# Patient Record
Sex: Male | Born: 1960 | Race: Black or African American | Hispanic: No | Marital: Single | Smoking: Never smoker
Health system: Southern US, Community
[De-identification: ages and names within clinical notes are randomized; demographics above are authoritative.]

## PROBLEM LIST (undated history)

## (undated) DIAGNOSIS — M542 Cervicalgia: Secondary | ICD-10-CM

## (undated) DIAGNOSIS — M545 Low back pain: Secondary | ICD-10-CM

## (undated) DIAGNOSIS — I10 Essential (primary) hypertension: Secondary | ICD-10-CM

## (undated) DIAGNOSIS — F411 Generalized anxiety disorder: Secondary | ICD-10-CM

## (undated) DIAGNOSIS — G4730 Sleep apnea, unspecified: Secondary | ICD-10-CM

## (undated) DIAGNOSIS — F329 Major depressive disorder, single episode, unspecified: Secondary | ICD-10-CM

## (undated) DIAGNOSIS — J069 Acute upper respiratory infection, unspecified: Secondary | ICD-10-CM

## (undated) DIAGNOSIS — K219 Gastro-esophageal reflux disease without esophagitis: Secondary | ICD-10-CM

## (undated) DIAGNOSIS — G4733 Obstructive sleep apnea (adult) (pediatric): Secondary | ICD-10-CM

## (undated) DIAGNOSIS — E785 Hyperlipidemia, unspecified: Secondary | ICD-10-CM

## (undated) HISTORY — DX: Generalized anxiety disorder: F41.1

## (undated) HISTORY — DX: Major depressive disorder, single episode, unspecified: F32.9

## (undated) HISTORY — DX: Gastro-esophageal reflux disease without esophagitis: K21.9

## (undated) HISTORY — DX: Sleep apnea, unspecified: G47.30

## (undated) HISTORY — PX: FOOT SURGERY: SHX648

## (undated) HISTORY — DX: Cervicalgia: M54.2

## (undated) HISTORY — DX: Essential (primary) hypertension: I10

## (undated) HISTORY — DX: Acute upper respiratory infection, unspecified: J06.9

## (undated) HISTORY — DX: Low back pain: M54.5

## (undated) HISTORY — DX: Obstructive sleep apnea (adult) (pediatric): G47.33

## (undated) HISTORY — DX: Hyperlipidemia, unspecified: E78.5

---

## 1999-01-13 ENCOUNTER — Inpatient Hospital Stay (HOSPITAL_COMMUNITY): Admission: AD | Admit: 1999-01-13 | Discharge: 1999-01-13 | Payer: Self-pay | Admitting: *Deleted

## 2001-11-26 ENCOUNTER — Ambulatory Visit (HOSPITAL_BASED_OUTPATIENT_CLINIC_OR_DEPARTMENT_OTHER): Admission: RE | Admit: 2001-11-26 | Discharge: 2001-11-26 | Payer: Self-pay | Admitting: Pulmonary Disease

## 2001-11-26 ENCOUNTER — Encounter: Payer: Self-pay | Admitting: Pulmonary Disease

## 2003-07-05 ENCOUNTER — Emergency Department (HOSPITAL_COMMUNITY): Admission: EM | Admit: 2003-07-05 | Discharge: 2003-07-05 | Payer: Self-pay | Admitting: Emergency Medicine

## 2004-01-21 ENCOUNTER — Ambulatory Visit: Payer: Self-pay | Admitting: Internal Medicine

## 2004-04-13 ENCOUNTER — Ambulatory Visit: Payer: Self-pay | Admitting: Internal Medicine

## 2004-04-30 ENCOUNTER — Emergency Department (HOSPITAL_COMMUNITY): Admission: EM | Admit: 2004-04-30 | Discharge: 2004-05-01 | Payer: Self-pay | Admitting: Emergency Medicine

## 2004-05-19 ENCOUNTER — Encounter: Admission: RE | Admit: 2004-05-19 | Discharge: 2004-05-19 | Payer: Self-pay | Admitting: Occupational Medicine

## 2004-07-22 ENCOUNTER — Ambulatory Visit: Payer: Self-pay | Admitting: Internal Medicine

## 2006-02-23 ENCOUNTER — Ambulatory Visit: Payer: Self-pay | Admitting: Internal Medicine

## 2006-02-23 LAB — CONVERTED CEMR LAB
Basophils Absolute: 0 10*3/uL (ref 0.0–0.1)
CO2: 28 meq/L (ref 19–32)
Chloride: 106 meq/L (ref 96–112)
Chol/HDL Ratio, serum: 4.5
Cholesterol: 159 mg/dL (ref 0–200)
Creatinine, Ser: 1.1 mg/dL (ref 0.4–1.5)
Eosinophil percent: 0.7 % (ref 0.0–5.0)
Glucose, Bld: 94 mg/dL (ref 70–99)
HCT: 43.6 % (ref 39.0–52.0)
HDL: 35.3 mg/dL — ABNORMAL LOW (ref >39.0)
Lymphocytes Relative: 23.4 % (ref 12.0–46.0)
MCV: 89.3 fL (ref 78.0–100.0)
Neutro Abs: 5.6 10*3/uL (ref 1.4–7.7)
Neutrophils Relative %: 68.6 % (ref 43.0–77.0)
Nitrite: NEGATIVE
Platelets: 244 10*3/uL (ref 150–400)
Sodium: 141 meq/L (ref 135–145)
Urine Glucose: NEGATIVE mg/dL
WBC: 8.1 10*3/uL (ref 4.5–10.5)

## 2006-02-28 ENCOUNTER — Ambulatory Visit: Payer: Self-pay | Admitting: Internal Medicine

## 2007-01-10 ENCOUNTER — Ambulatory Visit: Payer: Self-pay | Admitting: Internal Medicine

## 2007-01-10 LAB — CONVERTED CEMR LAB
AST: 32 units/L (ref 0–37)
Albumin: 4 g/dL (ref 3.5–5.2)
Basophils Absolute: 0.2 10*3/uL — ABNORMAL HIGH (ref 0.0–0.1)
Chloride: 104 meq/L (ref 96–112)
Cholesterol: 160 mg/dL (ref 0–200)
Eosinophils Absolute: 0.1 10*3/uL (ref 0.0–0.6)
GFR calc Af Amer: 83 mL/min
GFR calc non Af Amer: 69 mL/min
HCT: 41.8 % (ref 39.0–52.0)
HDL: 30.7 mg/dL — ABNORMAL LOW (ref >39.0)
Hemoglobin, Urine: NEGATIVE
Ketones, ur: NEGATIVE mg/dL
LDL Cholesterol: 116 mg/dL — ABNORMAL HIGH (ref 0–99)
MCHC: 34.3 g/dL (ref 30.0–36.0)
MCV: 88.1 fL (ref 78.0–100.0)
Neutro Abs: 4.7 10*3/uL (ref 1.4–7.7)
Neutrophils Relative %: 59.9 % (ref 43.0–77.0)
PSA: 0.67 ng/mL (ref 0.10–4.00)
Potassium: 4.1 meq/L (ref 3.5–5.1)
RBC: 4.74 M/uL (ref 4.22–5.81)
Sodium: 137 meq/L (ref 135–145)
TSH: 2.39 microintl units/mL (ref 0.35–5.50)
Total CHOL/HDL Ratio: 5.2
Urine Glucose: NEGATIVE mg/dL
Urobilinogen, UA: 0.2 (ref 0.0–1.0)

## 2007-01-17 ENCOUNTER — Encounter: Payer: Self-pay | Admitting: Internal Medicine

## 2007-01-17 ENCOUNTER — Ambulatory Visit: Payer: Self-pay | Admitting: Internal Medicine

## 2007-01-17 DIAGNOSIS — I10 Essential (primary) hypertension: Secondary | ICD-10-CM | POA: Insufficient documentation

## 2007-01-17 DIAGNOSIS — F411 Generalized anxiety disorder: Secondary | ICD-10-CM

## 2007-01-17 DIAGNOSIS — F3289 Other specified depressive episodes: Secondary | ICD-10-CM

## 2007-01-17 DIAGNOSIS — K219 Gastro-esophageal reflux disease without esophagitis: Secondary | ICD-10-CM

## 2007-01-17 DIAGNOSIS — G4733 Obstructive sleep apnea (adult) (pediatric): Secondary | ICD-10-CM

## 2007-01-17 DIAGNOSIS — M545 Low back pain, unspecified: Secondary | ICD-10-CM

## 2007-01-17 DIAGNOSIS — F329 Major depressive disorder, single episode, unspecified: Secondary | ICD-10-CM

## 2007-01-17 DIAGNOSIS — E785 Hyperlipidemia, unspecified: Secondary | ICD-10-CM

## 2007-01-17 HISTORY — DX: Generalized anxiety disorder: F41.1

## 2007-01-17 HISTORY — DX: Gastro-esophageal reflux disease without esophagitis: K21.9

## 2007-01-17 HISTORY — DX: Essential (primary) hypertension: I10

## 2007-01-17 HISTORY — DX: Major depressive disorder, single episode, unspecified: F32.9

## 2007-01-17 HISTORY — DX: Obstructive sleep apnea (adult) (pediatric): G47.33

## 2007-01-17 HISTORY — DX: Low back pain, unspecified: M54.50

## 2007-01-17 HISTORY — DX: Other specified depressive episodes: F32.89

## 2007-01-17 HISTORY — DX: Hyperlipidemia, unspecified: E78.5

## 2007-02-20 ENCOUNTER — Ambulatory Visit: Payer: Self-pay | Admitting: Internal Medicine

## 2008-03-04 ENCOUNTER — Ambulatory Visit: Payer: Self-pay | Admitting: Internal Medicine

## 2008-03-04 LAB — CONVERTED CEMR LAB
ALT: 27 units/L (ref 0–53)
Basophils Absolute: 0 10*3/uL (ref 0.0–0.1)
Basophils Relative: 0.3 % (ref 0.0–3.0)
Bilirubin Urine: NEGATIVE
Bilirubin, Direct: 0.1 mg/dL (ref 0.0–0.3)
CO2: 31 meq/L (ref 19–32)
Calcium: 9.2 mg/dL (ref 8.4–10.5)
Cholesterol: 168 mg/dL (ref 0–200)
Creatinine, Ser: 1.1 mg/dL (ref 0.4–1.5)
GFR calc Af Amer: 92 mL/min
Glucose, Bld: 95 mg/dL (ref 70–99)
HCT: 45.2 % (ref 39.0–52.0)
HDL: 30.3 mg/dL — ABNORMAL LOW (ref >39.0)
Hemoglobin: 15.5 g/dL (ref 13.0–17.0)
Ketones, ur: NEGATIVE mg/dL
MCHC: 34.2 g/dL (ref 30.0–36.0)
Monocytes Absolute: 0.5 10*3/uL (ref 0.1–1.0)
Neutro Abs: 6.3 10*3/uL (ref 1.4–7.7)
PSA: 0.84 ng/mL (ref 0.10–4.00)
RBC: 5.07 M/uL (ref 4.22–5.81)
RDW: 12.4 % (ref 11.5–14.6)
Sodium: 140 meq/L (ref 135–145)
Specific Gravity, Urine: 1.01 (ref 1.000–1.03)
TSH: 2.44 microintl units/mL (ref 0.35–5.50)
Total Protein, Urine: NEGATIVE mg/dL
Total Protein: 7.8 g/dL (ref 6.0–8.3)
Triglycerides: 96 mg/dL (ref 0–149)
Urine Glucose: NEGATIVE mg/dL
pH: 7.5 (ref 5.0–8.0)

## 2008-03-05 ENCOUNTER — Encounter (INDEPENDENT_AMBULATORY_CARE_PROVIDER_SITE_OTHER): Payer: Self-pay | Admitting: *Deleted

## 2008-04-07 ENCOUNTER — Ambulatory Visit: Payer: Self-pay | Admitting: Pulmonary Disease

## 2008-04-20 ENCOUNTER — Encounter: Payer: Self-pay | Admitting: Pulmonary Disease

## 2008-05-12 ENCOUNTER — Encounter: Payer: Self-pay | Admitting: Pulmonary Disease

## 2008-05-18 ENCOUNTER — Encounter: Payer: Self-pay | Admitting: Pulmonary Disease

## 2009-01-12 ENCOUNTER — Ambulatory Visit: Payer: Self-pay | Admitting: Internal Medicine

## 2009-01-12 LAB — CONVERTED CEMR LAB
ALT: 33 units/L (ref 0–53)
AST: 26 units/L (ref 0–37)
Alkaline Phosphatase: 102 units/L (ref 39–117)
BUN: 12 mg/dL (ref 6–23)
Basophils Absolute: 0.1 10*3/uL (ref 0.0–0.1)
Bilirubin, Direct: 0.1 mg/dL (ref 0.0–0.3)
CO2: 30 meq/L (ref 19–32)
Calcium: 9 mg/dL (ref 8.4–10.5)
Creatinine, Ser: 1.1 mg/dL (ref 0.4–1.5)
Eosinophils Relative: 0.8 % (ref 0.0–5.0)
GFR calc non Af Amer: 90.95 mL/min (ref >60)
Glucose, Bld: 93 mg/dL (ref 70–99)
HCT: 43 % (ref 39.0–52.0)
Ketones, ur: NEGATIVE mg/dL
LDL Cholesterol: 104 mg/dL — ABNORMAL HIGH (ref 0–99)
Leukocytes, UA: NEGATIVE
Lymphocytes Relative: 21.4 % (ref 12.0–46.0)
Monocytes Relative: 5.5 % (ref 3.0–12.0)
Neutrophils Relative %: 71.5 % (ref 43.0–77.0)
Nitrite: NEGATIVE
Platelets: 200 10*3/uL (ref 150.0–400.0)
RDW: 12.6 % (ref 11.5–14.6)
Sodium: 140 meq/L (ref 135–145)
Specific Gravity, Urine: 1.015 (ref 1.000–1.030)
Total Bilirubin: 0.9 mg/dL (ref 0.3–1.2)
Total CHOL/HDL Ratio: 5
Triglycerides: 110 mg/dL (ref 0.0–149.0)
Urobilinogen, UA: 0.2 (ref 0.0–1.0)
WBC: 8.6 10*3/uL (ref 4.5–10.5)

## 2009-01-19 ENCOUNTER — Ambulatory Visit: Payer: Self-pay | Admitting: Internal Medicine

## 2009-01-27 ENCOUNTER — Encounter: Payer: Self-pay | Admitting: Internal Medicine

## 2009-03-20 HISTORY — PX: OTHER SURGICAL HISTORY: SHX169

## 2009-03-26 ENCOUNTER — Telehealth: Payer: Self-pay | Admitting: Internal Medicine

## 2009-05-21 ENCOUNTER — Ambulatory Visit (HOSPITAL_COMMUNITY): Admission: RE | Admit: 2009-05-21 | Discharge: 2009-05-21 | Payer: Self-pay | Admitting: Orthopedic Surgery

## 2009-05-31 ENCOUNTER — Encounter: Payer: Self-pay | Admitting: Internal Medicine

## 2009-07-16 ENCOUNTER — Ambulatory Visit (HOSPITAL_COMMUNITY): Admission: RE | Admit: 2009-07-16 | Discharge: 2009-07-16 | Payer: Self-pay | Admitting: Orthopedic Surgery

## 2009-07-23 ENCOUNTER — Encounter: Payer: Self-pay | Admitting: Internal Medicine

## 2009-11-09 ENCOUNTER — Encounter: Payer: Self-pay | Admitting: Internal Medicine

## 2010-04-10 ENCOUNTER — Encounter: Payer: Self-pay | Admitting: Orthopedic Surgery

## 2010-04-19 NOTE — Progress Notes (Signed)
Summary: Levitra rx  Phone Note Call from Patient Call back at Home Phone [phone]   Summary of Call: Patient called requesting prescription for Levitra. It was never filled and just given to patient in case he wanted to fill it. I made patient aware we would send to pharmacy. Faxed same RX given to patient by MD. Initial call taken by: Lucious Groves,  March 26, 2009 10:45 AM  Follow-up for Phone Call        noted Follow-up by: [name] MD,  March 26, 2009 10:57 AM    New/Updated Medications: LEVITRA 20 MG  TABS (VARDENAFIL HCL) 1 by mouth once daily as needed Prescriptions: LEVITRA 20 MG  TABS (VARDENAFIL HCL) 1 by mouth once daily as needed  #5 x 11   Entered by:   Lucious Groves   Authorized by:   [name] MD   Signed by:   Lucious Groves on 03/26/2009   Method used:   Electronically to        [facility]. [phone]* (retail)       [address]       [place], [place]  [address]       Ph: [phone]       Fax: [phone]   RxID:   [id number]

## 2010-04-19 NOTE — Letter (Signed)
Summary: Referral - not able to see patient  [facility]  [address] [place], [place] [address]   Phone: [phone]  Fax: [phone]    November 09, 2009    [name], M.D. [address] [place], [place] [address]   Re:   Joseph Choi DOB:  [birth year] MRN:   [id number]    Dear Dr. Jonny Ruiz:  Thank you for your kind referral of the above patient.  We have attempted to schedule the recommended procedure Screening Colonoscopy but have not been able to schedule because:   X  The patient was not available by phone and/or has not returned our calls.  ___ The patient declined to schedule the procedure at this time.  We appreciate the referral and hope that we will have the opportunity to treat this patient in the future.    Sincerely,    [facility] [phone]

## 2010-04-19 NOTE — Miscellaneous (Signed)
Summary: Orders Update   Clinical Lists Changes  Orders: Added new Referral order of Gastroenterology Referral (GI) - Signed 

## 2010-04-19 NOTE — Letter (Signed)
Summary: CMN for CPAP Supplies/[facility]  CMN for CPAP Supplies/[facility]   Imported By: Sherian Rein 07/28/2009 07:48:56  _____________________________________________________________________  External Attachment:    Type:   Image     Comment:   External Document

## 2010-05-09 ENCOUNTER — Encounter: Payer: Self-pay | Admitting: Internal Medicine

## 2010-05-09 ENCOUNTER — Other Ambulatory Visit: Payer: Self-pay | Admitting: Internal Medicine

## 2010-05-09 ENCOUNTER — Ambulatory Visit (INDEPENDENT_AMBULATORY_CARE_PROVIDER_SITE_OTHER): Payer: BC Managed Care – PPO | Admitting: Internal Medicine

## 2010-05-09 ENCOUNTER — Encounter (INDEPENDENT_AMBULATORY_CARE_PROVIDER_SITE_OTHER): Payer: Self-pay | Admitting: *Deleted

## 2010-05-09 ENCOUNTER — Other Ambulatory Visit: Payer: BC Managed Care – PPO

## 2010-05-09 DIAGNOSIS — M542 Cervicalgia: Secondary | ICD-10-CM | POA: Insufficient documentation

## 2010-05-09 DIAGNOSIS — Z0000 Encounter for general adult medical examination without abnormal findings: Secondary | ICD-10-CM

## 2010-05-09 DIAGNOSIS — J069 Acute upper respiratory infection, unspecified: Secondary | ICD-10-CM

## 2010-05-09 HISTORY — DX: Acute upper respiratory infection, unspecified: J06.9

## 2010-05-09 HISTORY — DX: Cervicalgia: M54.2

## 2010-05-09 LAB — URINALYSIS
Bilirubin Urine: NEGATIVE
Hgb urine dipstick: NEGATIVE
Ketones, ur: NEGATIVE
Leukocytes, UA: NEGATIVE
Urobilinogen, UA: 0.2 (ref 0.0–1.0)
pH: 7 (ref 5.0–8.0)

## 2010-05-09 LAB — LIPID PANEL
LDL Cholesterol: 89 mg/dL (ref 0–99)
Total CHOL/HDL Ratio: 4
VLDL: 9.8 mg/dL (ref 0.0–40.0)

## 2010-05-09 LAB — CBC WITH DIFFERENTIAL/PLATELET
Basophils Absolute: 0 10*3/uL (ref 0.0–0.1)
Hemoglobin: 14.9 g/dL (ref 13.0–17.0)
Lymphocytes Relative: 19.6 % (ref 12.0–46.0)
Monocytes Relative: 10.6 % (ref 3.0–12.0)
Platelets: 196 10*3/uL (ref 150.0–400.0)
RDW: 13.8 % (ref 11.5–14.6)
WBC: 9.1 10*3/uL (ref 4.5–10.5)

## 2010-05-09 LAB — PSA: PSA: 0.79 ng/mL (ref 0.10–4.00)

## 2010-05-09 LAB — HEPATIC FUNCTION PANEL
AST: 25 U/L (ref 0–37)
Alkaline Phosphatase: 98 U/L (ref 39–117)
Bilirubin, Direct: 0.1 mg/dL (ref 0.0–0.3)
Total Bilirubin: 0.6 mg/dL (ref 0.3–1.2)

## 2010-05-09 LAB — BASIC METABOLIC PANEL
BUN: 15 mg/dL (ref 6–23)
Calcium: 9.4 mg/dL (ref 8.4–10.5)
GFR: 78.79 mL/min (ref >60.00)
Glucose, Bld: 80 mg/dL (ref 70–99)
Sodium: 141 mEq/L (ref 135–145)

## 2010-05-09 LAB — TSH: TSH: 2.57 u[IU]/mL (ref 0.35–5.50)

## 2010-05-17 NOTE — Assessment & Plan Note (Signed)
Summary: last ov 2010/needs bp evaluated   Vital Signs:  Patient profile:   50 year old male Height:      67.5 inches Weight:      298.25 pounds BMI:     46.19 O2 Sat:      97 % on Room air Temp:     99.1 degrees F oral Pulse rate:   54 / minute BP sitting:   166 / 102  (left arm) Cuff size:   large  Vitals Entered By: Zella Ball Ewing CMA (AAMA) (May 09, 2010 2:24 PM)  O2 Flow:  Room air  CC: BP elevated, right side of neck and shoulder pain/RE   Primary Care Provider:  Dr. Jonny Ruiz  CC:  BP elevated and right side of neck and shoulder pain/RE.  History of Present Illness: here for wellness and f/u;  overall doing ok;  Pt denies CP, worsening sob, doe, wheezing, orthopnea, pnd, worsening LE edema, palps, dizziness or syncope  Pt denies new neuro symptoms such as headache, facial or extremity weakness  Pt denies polydipsia, polyuria   Overall good compliance with meds, trying to follow low chol  diet, wt down 50 lbs with better diet, little excercise however .  No fever, wt loss, night sweats, loss of appetite or other constitutional symptoms  Overall good compliance with meds, and good tolerability.  Denies worsening depressive symptoms, suicidal ideation, or panic.   Pt states good ability with ADL's, low fall risk, home safety reviewed and adequate, no significant change in hearing or vision, trying to follow lower chol diet, and occasionally active only with regular excercise.   Also incidently with 3-4 days onset sharp pain , constant, 7/10, throbbing - like to the right neck/upper back, worse to turn the head,  non radicular, No RUE pain/weak/numb, bowel or bladder change, gait change, fall, injury, fever, wt loss.   Pt did quite a bit of lifting at work recently,  no rash, fever, trauma.  Nothing makes better.   Unaware of any URI symtpoms or fever today  Preventive Screening-Counseling & Management      Drug Use:  no.    Problems Prior to Update: 1)  Uri  (ICD-465.9) 2)   Neck Pain, Right  (ICD-723.1) 3)  Preventive Health Care  (ICD-V70.0) 4)  Preventive Health Care  (ICD-V70.0) 5)  Family History Diabetes 1st Degree Relative  (ICD-V18.0) 6)  Anxiety  (ICD-300.00) 7)  Depression  (ICD-311) 8)  Gerd  (ICD-530.81) 9)  Obstructive Sleep Apnea  (ICD-327.23) 10)  Hyperlipidemia  (ICD-272.4) 11)  Low Back Pain  (ICD-724.2) 12)  Hypertension  (ICD-401.9)  Medications Prior to Update: 1)  Lotrel 5-20 Mg Caps (Amlodipine Besy-Benazepril Hcl) .Marland Kitchen.. 1 By Mouth Once Daily 2)  Adult Aspirin Ec Low Strength 81 Mg Tbec (Aspirin) .Marland Kitchen.. 1 By Mouth Once Daily 3)  Cpap 13 Advanced  Current Medications (verified): 1)  Lotrel 5-20 Mg Caps (Amlodipine Besy-Benazepril Hcl) .Marland Kitchen.. 1 By Mouth Once Daily 2)  Adult Aspirin Ec Low Strength 81 Mg Tbec (Aspirin) .Marland Kitchen.. 1 By Mouth Once Daily 3)  Cpap 13 Advanced 4)  Prednisone 10 Mg Tabs (Prednisone) .... 3po Qd For 3days, Then 2po Qd For 3days, Then 1po Qd For 3days, Then Stop 5)  Tramadol Hcl 100 Mg Xr24h-Tab (Tramadol Hcl) .Marland Kitchen.. 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 no children/fiancee died 08-29-86 Drug use-no  Risk Factors: Smoking Status: never (01/17/2007)  Past Surgical History: s/p bilateral knee arthroscopy August 28, 2009 - Dr Darrelyn Hillock  Social History: Never Smoked 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; 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         all otherwise negative per pt -    Physical Exam  General:  alert and overweight-appearing.   Head:  normocephalic and atraumatic.   Eyes:  vision grossly intact, pupils equal, and pupils round.   Ears:  R ear normal and L ear normal.   Nose:  no external deformity and no nasal discharge.   Mouth:  pharyngeal erythema and fair dentition.   Neck:  supple and no masses.   Lungs:  normal respiratory effort and normal breath sounds.   Heart:  normal rate and regular rhythm.   Abdomen:  soft, non-tender, and normal bowel sounds.   Msk:  no joint tenderness and no joint swelling.   Extremities:  no edema, no erythema  Neurologic:  cranial nerves II-XII intact and strength normal in all extremities.   Skin:  color normal and no rashes.   Psych:  not anxious appearing and not depressed appearing.     Impression & Recommendations:  Problem # 1:  Preventive Health Care (ICD-V70.0) Overall doing well, age appropriate education and counseling updated, referral for preventive services and immunizations addressed, dietary counseling and smoking status adressed , most recent labs reviewed I have personally reviewed and have noted 1.The patient's medical and social history 2.Their use of alcohol, tobacco or illicit drugs 3.Their current medications and supplements 4. Functional ability including ADL's, fall risk, home safety risk, hearing & visual impairment  5.Diet and physical activities 6.Evidence for depression or  mood disorders The patients weight, height, BMI  have been recorded in the chart I have made referrals, counseling and provided education to the patient based review of the above  Orders: Gastroenterology Referral (GI) TLB-BMP (Basic Metabolic Panel-BMET) (80048-METABOL) TLB-CBC Platelet - w/Differential (85025-CBCD) TLB-Hepatic/Liver Function Pnl (80076-HEPATIC) TLB-Lipid Panel (80061-LIPID) TLB-TSH (Thyroid Stimulating Hormone) (84443-TSH) TLB-PSA (Prostate Specific Antigen) (84153-PSA) TLB-Udip ONLY (81003-UDIP)  Problem # 2:  NECK PAIN, RIGHT (ICD-723.1)  His updated medication list for this problem includes:    Adult Aspirin Ec Low Strength 81 Mg Tbec (Aspirin) .Marland Kitchen... 1 by mouth once daily    Tramadol Hcl 100 Mg Xr24h-tab (Tramadol hcl) .Marland Kitchen... 1-2 by mouth once daily as needed pain unclear etiology, mod to severe pain, but exam o/w benign for acute - for pain med/predpack , f/u any persistent or worsening symtpoms , treat as above, f/u any worsening signs or symptoms  Discussed exercises and use of moist heat or cold and medication.   Problem # 3:  HYPERTENSION (ICD-401.9)  His updated  medication list for this problem includes:    Lotrel 5-20 Mg Caps (Amlodipine besy-benazepril hcl) .Marland Kitchen... 1 by mouth once daily to re-start med, f/u BP at home and next visit, cont wt loss efforts as he hass been doing   BP today: 166/102 Prior BP: 138/82 (01/19/2009)  Labs Reviewed: K+: 4.2 (01/12/2009) Creat: : 1.1 (01/12/2009)   Chol: 158 (01/12/2009)   HDL: 32.00 (01/12/2009)   LDL: 104 (01/12/2009)   TG: 110.0 (01/12/2009)  Problem # 4:  URI (ICD-465.9)  His updated medication list for this problem includes:    Adult Aspirin Ec Low Strength 81 Mg Tbec (Aspirin) .Marland Kitchen... 1 by mouth once daily incidently prob viral;  to avoid decongestants  Instructed on symptomatic treatment. Call if symptoms persist or worsen.   Complete Medication List: 1)  Lotrel 5-20 Mg Caps (Amlodipine  besy-benazepril hcl) .Marland Kitchen.. 1 by mouth once daily 2)  Adult Aspirin Ec Low Strength 81 Mg Tbec (Aspirin) .Marland Kitchen.. 1 by mouth once daily 3)  Cpap 13 Advanced  4)  Prednisone 10 Mg Tabs (Prednisone) .... 3po qd for 3days, then 2po qd for 3days, then 1po qd for 3days, then stop 5)  Tramadol Hcl 100 Mg Xr24h-tab (Tramadol hcl) .Marland Kitchen.. 1-2 by mouth once daily as needed pain  Patient Instructions: 1)  Continue all previous medications as before this visit  2)  You will be contacted about the referral(s) to: colonscopy (for the summer) 3)  Please go to the Lab in the basement for your blood and/or urine tests today 4)  Please call the number on the [facility] Card for results of your testing  5)  Please schedule a follow-up appointment in 6 months, or sooner if needed 6)  Check your Blood Pressure regularly. If it is above 140/90: you should make an appointment. Prescriptions: TRAMADOL HCL 100 MG XR24H-TAB (TRAMADOL HCL) 1-2 by mouth once daily as needed pain  #60 x 0   Entered and Authorized by:   [name] MD   Signed by:   [name] MD on 05/09/2010   Method used:   Print then Give to Patient   RxID:   [id number] PREDNISONE 10 MG TABS (PREDNISONE) 3po qd for 3days, then 2po qd for 3days, then 1po qd for 3days, then stop  #18 x 0   Entered and Authorized by:   [name] MD   Signed by:   [name] MD on 05/09/2010   Method used:   Print then Give to Patient   RxID:   [id number] LOTREL 5-20 MG CAPS (AMLODIPINE BESY-BENAZEPRIL HCL) 1 by mouth once daily  #90 x 3   Entered and Authorized by:   [name] MD   Signed by:   [name] MD on 05/09/2010   Method used:   Print then Give to Patient   RxID:   [id number]    Orders Added: 1)  Gastroenterology Referral [GI] 2)  TLB-BMP (Basic Metabolic Panel-BMET) [80048-METABOL] 3)  TLB-CBC Platelet - w/Differential [85025-CBCD] 4)  TLB-Hepatic/Liver Function Pnl [80076-HEPATIC] 5)  TLB-Lipid Panel [80061-LIPID] 6)  TLB-TSH  (Thyroid Stimulating Hormone) [84443-TSH] 7)  TLB-PSA (Prostate Specific Antigen) [84153-PSA] 8)  TLB-Udip ONLY [81003-UDIP] 9)  Est. Patient 40-64 years [01027]

## 2010-06-07 LAB — DIFFERENTIAL
Eosinophils Absolute: 0.1 10*3/uL (ref 0.0–0.7)
Lymphs Abs: 2.2 10*3/uL (ref 0.7–4.0)
Monocytes Relative: 6 % (ref 3–12)
Neutro Abs: 7.7 10*3/uL (ref 1.7–7.7)
Neutrophils Relative %: 72 % (ref 43–77)

## 2010-06-07 LAB — CBC
Hemoglobin: 14.1 g/dL (ref 13.0–17.0)
MCHC: 33.2 g/dL (ref 30.0–36.0)
RDW: 13.5 % (ref 11.5–15.5)

## 2010-06-07 LAB — COMPREHENSIVE METABOLIC PANEL
ALT: 31 U/L (ref 0–53)
Calcium: 9.4 mg/dL (ref 8.4–10.5)
Glucose, Bld: 88 mg/dL (ref 70–99)
Sodium: 141 mEq/L (ref 135–145)
Total Protein: 7.6 g/dL (ref 6.0–8.3)

## 2010-06-07 LAB — URINALYSIS, ROUTINE W REFLEX MICROSCOPIC
Nitrite: NEGATIVE
Specific Gravity, Urine: 1.013 (ref 1.005–1.030)
Urobilinogen, UA: 0.2 mg/dL (ref 0.0–1.0)

## 2010-06-07 LAB — PROTIME-INR: INR: 1.18 (ref 0.00–1.49)

## 2010-06-07 LAB — APTT: aPTT: 33 seconds (ref 24–37)

## 2010-06-10 LAB — COMPREHENSIVE METABOLIC PANEL
ALT: 24 U/L (ref 0–53)
AST: 22 U/L (ref 0–37)
Albumin: 3.9 g/dL (ref 3.5–5.2)
CO2: 30 mEq/L (ref 19–32)
Calcium: 9.2 mg/dL (ref 8.4–10.5)
Chloride: 106 mEq/L (ref 96–112)
GFR calc Af Amer: >60 mL/min (ref >60)
GFR calc non Af Amer: >60 mL/min (ref >60)
Sodium: 141 mEq/L (ref 135–145)
Total Bilirubin: 0.8 mg/dL (ref 0.3–1.2)

## 2010-06-10 LAB — URINALYSIS, ROUTINE W REFLEX MICROSCOPIC
Glucose, UA: NEGATIVE mg/dL
Hgb urine dipstick: NEGATIVE
Protein, ur: NEGATIVE mg/dL
pH: 7.5 (ref 5.0–8.0)

## 2010-06-10 LAB — PROTIME-INR: Prothrombin Time: 14.4 seconds (ref 11.6–15.2)

## 2010-06-10 LAB — DIFFERENTIAL
Eosinophils Absolute: 0 10*3/uL (ref 0.0–0.7)
Eosinophils Relative: 1 % (ref 0–5)
Lymphs Abs: 1.7 10*3/uL (ref 0.7–4.0)
Monocytes Absolute: 0.4 10*3/uL (ref 0.1–1.0)

## 2010-06-10 LAB — CBC
RBC: 4.98 MIL/uL (ref 4.22–5.81)
WBC: 8.1 10*3/uL (ref 4.0–10.5)

## 2010-08-05 NOTE — Discharge Summary (Signed)
[place]. [facility]  Patient:    Joseph Choi, Joseph Choi                        MRN: [id number] Adm. Date:  01/13/99 Disc. Date: 01/13/99 Attending:  [name], M.D. [facility] Dictator:   [name], P.A. CC:         [name], M.D. [facility]   Discharge Summary  PROCEDURES:  Cardiac catheterization.  HOSPITAL COURSE:  Joseph Choi is a 50 year old male, without prior history of heart disease, who was recently evaluated in our office by Dr. [name] for abnormal EKG and new onset chest pain.  He was referred for stress testing; this was performed October 18 and profusion images were suggestive of mild inferobasal ischemia. f note, the patient did not report any chest pain during the test, nor was there ny EKG evidence of ischemia.  Calculated EF was 76%.  Given this finding, however,  plan was to proceed with diagnostic coronary angiography as an outpatient, and n fact, patient was scheduled to undergo this procedure the day following this admission.  However, over the past few days, the patient reported progressive and more persistent chest pain.  He called the cardiology office on the day of admission and was instructed to proceed directly to the hospital for same day coronary angiography.  Upon admission, Joseph Choi did note some persistent chest pain and reported some relief after one sublingual nitroglycerin tablet.  An electrocardiogram was, however, nondiagnostic.  Patient was taken shortly thereafter directly to the cardiac catheterization lab, where he underwent angiography by Dr. [name] (see cath report for full details). Angiogram revealed normal coronary arteries and LV gram revealed no wall motion  abnormality with EF approximately 60%.  Additionally, distal aortogram revealed no renal vascular or aortoiliac disease.  Patient was cleared for discharge later that evening and was placed on a trial f pro time pump inhibitor for  possible GERD etiology.  LABORATORY DATA:  (Preadmission):  Normal CBC.  Normal B-MET.  INR of 1.3.  MEDICATIONS: 1. Prilosec 20 mg q.d. 2. Zestoretic 20/12.5 mg q.d.  INSTRUCTIONS:  ACTIVITY:  Patient is to refrain from heavy lifting/driving/strenuous activity 48 hours.  FOLLOW-UP:  He is to call the cardiology office if he has any swelling/bleeding  from the right groin.  Patient is instructed to follow up with his primary care physician, Dr. [name], in the next week for further evaluation.  DISCHARGE DIAGNOSES: 1. Nonischemic chest pain.    a. Normal coronary artery/left ventricle - cardiac catheterization October 26. 2. _________ . 3. Obesity. 4. Obstructive sleep apnea. DD:  01/13/99 TD:  01/13/99 Job: [id number]

## 2010-08-05 NOTE — Cardiovascular Report (Signed)
[place]  Patient:    SIDDIQ, KALUZNY Visit Number: [id number] MRN: [id number]          Service Type: Attending:  [name], M.D. [facility] Dictated by:   [name], M.D. [facility] Proc. Date: 01/13/99   CC:         [name], M.D. [facility]  Luis Abed, M.D. [facility]  Cardiopulmonary Laboratory   Cardiac Catheterization  PROCEDURES PERFORMED: Cardiac catheterization.  CLINICAL HISTORY: The patient is a 50 year old gentleman, who was admitted with chest pain. He has a history of hypertension and obstructive sleep apnea and obesity.  DESCRIPTION OF PROCEDURE: The procedure was performed via the right femoral artery using an arterial sheath and 6 French preformed coronary catheters.  A front wall arterial puncture was performed and Omnipaque contrast was used. The patient tolerated the procedure well and left the laboratory in satisfactory condition. The right femoral artery was closed with Perclose at the end of the procedure.  RESULTS: The left main coronary artery: The left main coronary artery was free of significant disease.  Left anterior descending: The left anterior descending artery gave rise to a diagonal branch and septal perforator.  These and the LAD proper were free of significant disease.  Circumflex artery: The circumflex artery gave rise to two marginal branches, an apical branch and a posterolateral branch. These vessels were free of significant disease.  Right coronary artery: The right coronary gave rise to a conus branch, a right ventricular branch, a posterior descending branch and two posterolateral branches.  These vessels were free of significant disease.  LEFT VENTRICULOGRAPHY: The left ventriculogram performed in the RAO projection showed good wall motion with no areas of hypokinesis. The estimated ejection fraction was 60%.  DISTAL AORTOGRAM: Distal aortogram was performed which showed no renal  artery stenosis and no aortoiliac obstruction.  CONCLUSIONS: Normal coronary angiography and left ventricular wall motion.  RECOMMENDATIONS: Reassurance. Dictated by:   Everardo Beals Juanda Chance, M.D. [facility] Attending:  Everardo Beals Juanda Chance, M.D. [facility] DD:  12/19/00 TD:  12/20/00 Job: [id number]

## 2010-10-19 ENCOUNTER — Ambulatory Visit: Payer: BC Managed Care – PPO | Admitting: Internal Medicine

## 2011-05-15 ENCOUNTER — Other Ambulatory Visit: Payer: Self-pay | Admitting: Internal Medicine

## 2011-11-13 ENCOUNTER — Other Ambulatory Visit: Payer: Self-pay | Admitting: Internal Medicine

## 2012-05-30 ENCOUNTER — Other Ambulatory Visit: Payer: Self-pay | Admitting: Internal Medicine

## 2012-05-31 ENCOUNTER — Other Ambulatory Visit: Payer: Self-pay | Admitting: Internal Medicine

## 2012-06-06 ENCOUNTER — Telehealth: Payer: Self-pay

## 2012-06-06 ENCOUNTER — Other Ambulatory Visit: Payer: Self-pay | Admitting: Internal Medicine

## 2012-06-06 MED ORDER — AMLODIPINE BESY-BENAZEPRIL HCL 5-20 MG PO CAPS: 1.0000 | ORAL_CAPSULE | Freq: Every day | ORAL | Status: DC

## 2012-06-06 NOTE — Telephone Encounter (Signed)
The patient called to inform he did schedule appointment on Monday 06/10/12 with Dr. Jonny Ruiz.  He did request his BP medication to be refilled as is out.  Informed would take care of.

## 2012-06-10 ENCOUNTER — Ambulatory Visit (INDEPENDENT_AMBULATORY_CARE_PROVIDER_SITE_OTHER): Payer: BC Managed Care – PPO | Admitting: Internal Medicine

## 2012-06-10 ENCOUNTER — Encounter: Payer: Self-pay | Admitting: Internal Medicine

## 2012-06-10 ENCOUNTER — Other Ambulatory Visit (INDEPENDENT_AMBULATORY_CARE_PROVIDER_SITE_OTHER): Payer: BC Managed Care – PPO

## 2012-06-10 VITALS — BP 140/86 | HR 73 | Temp 97.5°F | Ht 68.0 in | Wt 308.1 lb

## 2012-06-10 DIAGNOSIS — M25512 Pain in left shoulder: Secondary | ICD-10-CM

## 2012-06-10 DIAGNOSIS — Z0000 Encounter for general adult medical examination without abnormal findings: Secondary | ICD-10-CM

## 2012-06-10 DIAGNOSIS — M25519 Pain in unspecified shoulder: Secondary | ICD-10-CM

## 2012-06-10 DIAGNOSIS — Z0001 Encounter for general adult medical examination with abnormal findings: Secondary | ICD-10-CM | POA: Insufficient documentation

## 2012-06-10 LAB — HEPATIC FUNCTION PANEL
Alkaline Phosphatase: 98 U/L (ref 39–117)
Bilirubin, Direct: 0.1 mg/dL (ref 0.0–0.3)
Total Bilirubin: 0.7 mg/dL (ref 0.3–1.2)
Total Protein: 7.5 g/dL (ref 6.0–8.3)

## 2012-06-10 LAB — CBC WITH DIFFERENTIAL/PLATELET
Basophils Absolute: 0 10*3/uL (ref 0.0–0.1)
Basophils Relative: 0.3 % (ref 0.0–3.0)
Eosinophils Absolute: 0.1 10*3/uL (ref 0.0–0.7)
Hemoglobin: 14.6 g/dL (ref 13.0–17.0)
Lymphocytes Relative: 23.3 % (ref 12.0–46.0)
Lymphs Abs: 2.2 10*3/uL (ref 0.7–4.0)
MCHC: 34.2 g/dL (ref 30.0–36.0)
MCV: 88.2 fl (ref 78.0–100.0)
Monocytes Absolute: 0.5 10*3/uL (ref 0.1–1.0)
Neutro Abs: 6.6 10*3/uL (ref 1.4–7.7)
RDW: 13.3 % (ref 11.5–14.6)

## 2012-06-10 LAB — URINALYSIS, ROUTINE W REFLEX MICROSCOPIC
Bilirubin Urine: NEGATIVE
Hgb urine dipstick: NEGATIVE
Ketones, ur: NEGATIVE
Total Protein, Urine: NEGATIVE
Urine Glucose: NEGATIVE
Urobilinogen, UA: 0.2 (ref 0.0–1.0)

## 2012-06-10 LAB — BASIC METABOLIC PANEL
CO2: 28 mEq/L (ref 19–32)
Calcium: 9.4 mg/dL (ref 8.4–10.5)
Chloride: 102 mEq/L (ref 96–112)
Sodium: 138 mEq/L (ref 135–145)

## 2012-06-10 LAB — LIPID PANEL
HDL: 33.4 mg/dL — ABNORMAL LOW (ref >39.00)
LDL Cholesterol: 102 mg/dL — ABNORMAL HIGH (ref 0–99)
Total CHOL/HDL Ratio: 5

## 2012-06-10 MED ORDER — AMLODIPINE BESY-BENAZEPRIL HCL 5-20 MG PO CAPS: 1.0000 | ORAL_CAPSULE | Freq: Every day | ORAL | Status: DC

## 2012-06-10 MED ORDER — NAPROXEN 500 MG PO TABS: 500.0000 mg | ORAL_TABLET | Freq: Two times a day (BID) | ORAL | Status: DC

## 2012-06-10 NOTE — Progress Notes (Signed)
Subjective:    Patient ID: Joseph Choi, male    DOB: [birth year], 52 y.o.   MRN: [id number]  HPI Here for wellness and f/u;  Overall doing ok;  Pt denies CP, worsening SOB, DOE, wheezing, orthopnea, PND, worsening LE edema, palpitations, dizziness or syncope.  Pt denies neurological change such as new headache, facial or extremity weakness.  Pt denies polydipsia, polyuria, or low sugar symptoms. Pt states overall good compliance with treatment and medications, good tolerability, and has been trying to follow lower cholesterol diet.  Pt denies worsening depressive symptoms, suicidal ideation or panic. No fever, night sweats, wt loss, loss of appetite, or other constitutional symptoms.  Pt states good ability with ADL's, has low fall risk, home safety reviewed and adequate, no other significant changes in hearing or vision, and only occasionally active with exercise.  Out of BP meds,wants to re-start.  Has some left shoulder pain and achiness but no decreased ROM or radicular symtpoms, overall pain actually improved in last 10 days after started x 2 mo. Past Medical History  Diagnosis Date  . ANXIETY 01/17/2007  . DEPRESSION 01/17/2007  . GERD 01/17/2007  . HYPERLIPIDEMIA 01/17/2007  . HYPERTENSION 01/17/2007  . LOW BACK PAIN 01/17/2007  . NECK PAIN, RIGHT 05/09/2010  . OBSTRUCTIVE SLEEP APNEA 01/17/2007  . URI 05/09/2010   Past Surgical History  Procedure Laterality Date  . S/p bilateral knee arthroscopy  2011    Dr. Darrelyn Hillock    reports that he has never smoked. He does not have any smokeless tobacco history on file. He reports that he does not drink alcohol or use illicit drugs. family history includes Diabetes in his other. Not on File Current Outpatient Prescriptions on File Prior to Visit  Medication Sig Dispense Refill  . aspirin 81 MG tablet Take 81 mg by mouth daily.        . NON FORMULARY CPAP 13 advanced       No current facility-administered medications on file prior to visit.    Review of Systems Constitutional: Negative for diaphoresis, activity change, appetite change or unexpected weight change.  HENT: Negative for hearing loss, ear pain, facial swelling, mouth sores and neck stiffness.   Eyes: Negative for pain, redness and visual disturbance.  Respiratory: Negative for shortness of breath and wheezing.   Cardiovascular: Negative for chest pain and palpitations.  Gastrointestinal: Negative for diarrhea, blood in stool, abdominal distention or other pain Genitourinary: Negative for hematuria, flank pain or change in urine volume.  Musculoskeletal: Negative for myalgias and joint swelling.  Skin: Negative for color change and wound.  Neurological: Negative for syncope and numbness. other than noted Hematological: Negative for adenopathy.  Psychiatric/Behavioral: Negative for hallucinations, self-injury, decreased concentration and agitation.      Objective:   Physical Exam BP 140/86  Pulse 73  Temp(Src) 97.5 F (36.4 C) (Oral)  Ht 5\' 8"  (1.727 m)  Wt 308 lb 2 oz (139.765 kg)  BMI 46.86 kg/m2  SpO2 95% VS noted,  Constitutional: Pt is oriented to person, place, and time. Appears well-developed and well-nourished.  Head: Normocephalic and atraumatic.  Right Ear: External ear normal.  Left Ear: External ear normal.  Nose: Nose normal.  Mouth/Throat: Oropharynx is clear and moist.  Eyes: Conjunctivae and EOM are normal. Pupils are equal, round, and reactive to light.  Neck: Normal range of motion. Neck supple. No JVD present. No tracheal deviation present.  Cardiovascular: Normal rate, regular rhythm, normal heart sounds and intact distal pulses.  Pulmonary/Chest: Effort normal and breath sounds normal.  Abdominal: Soft. Bowel sounds are normal. There is no tenderness. No HSM  Musculoskeletal: Normal range of motion. Exhibits no edema.  Lymphadenopathy:  Has no cervical adenopathy.  Neurological: Pt is alert and oriented to person, place, and time.  Pt has normal reflexes. No cranial nerve deficit.  Skin: Skin is warm and dry. No rash noted.  Psychiatric:  Has  normal mood and affect. Behavior is normal.  Left shoulder NT, FROM       Assessment & Plan:

## 2012-06-10 NOTE — Assessment & Plan Note (Signed)
With benign exam, for nsaid prn,  to f/u any worsening symptoms or concerns

## 2012-06-10 NOTE — Assessment & Plan Note (Signed)

## 2012-06-10 NOTE — Patient Instructions (Addendum)
Please restart your Blood Pressure medication Please take all new medication as prescribed - the anti-inflammatory for the left shoulder pain if needed Please continue all other medications as before Please keep your appointments with your specialists as you have planned  - podiatry for the left foot if needed You will be contacted regarding the referral for: colonoscopy Please go to the LAB in the Basement (turn left off the elevator) for the tests to be done today You will be contacted by phone if any changes need to be made immediately.  Otherwise, you will receive a letter about your results with an explanation, but please check with MyChart first. Thank you for enrolling in MyChart. Please follow the instructions below to securely access your online medical record. MyChart allows you to send messages to your doctor, view your test results, renew your prescriptions, schedule appointments, and more. To Log into My Chart online, please go by [employer] or [employer] to [email or web address], or download the MyChart App from the [employer] of [employer] .  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 Please return in 1 year for your yearly visit, or sooner if needed, with Lab testing done 3-5 days before\

## 2012-10-24 ENCOUNTER — Ambulatory Visit (INDEPENDENT_AMBULATORY_CARE_PROVIDER_SITE_OTHER): Payer: BC Managed Care – PPO | Admitting: Internal Medicine

## 2012-10-24 ENCOUNTER — Ambulatory Visit (INDEPENDENT_AMBULATORY_CARE_PROVIDER_SITE_OTHER)
Admission: RE | Admit: 2012-10-24 | Discharge: 2012-10-24 | Disposition: A | Discharge status: Home / Self Care | Payer: BC Managed Care – PPO | Source: Ambulatory Visit | Attending: Internal Medicine | Admitting: Internal Medicine

## 2012-10-24 ENCOUNTER — Encounter: Payer: Self-pay | Admitting: Internal Medicine

## 2012-10-24 VITALS — BP 132/82 | HR 71 | Temp 99.0°F | Ht 68.0 in | Wt 311.2 lb

## 2012-10-24 DIAGNOSIS — F411 Generalized anxiety disorder: Secondary | ICD-10-CM

## 2012-10-24 DIAGNOSIS — I10 Essential (primary) hypertension: Secondary | ICD-10-CM

## 2012-10-24 DIAGNOSIS — M542 Cervicalgia: Secondary | ICD-10-CM

## 2012-10-24 MED ORDER — TRAMADOL HCL 50 MG PO TABS: 50.0000 mg | ORAL_TABLET | Freq: Four times a day (QID) | ORAL | Status: DC | PRN

## 2012-10-24 MED ORDER — PREDNISONE 10 MG PO TABS: ORAL_TABLET | ORAL | Status: DC

## 2012-10-24 NOTE — Assessment & Plan Note (Signed)
stable overall by history and exam, recent data reviewed with pt, and pt to continue medical treatment as before,  to f/u any worsening symptoms or concerns Lab Results  Component Value Date   WBC 9.4 06/10/2012   HGB 14.6 06/10/2012   HCT 42.8 06/10/2012   PLT 213.0 06/10/2012   GLUCOSE 91 06/10/2012   CHOL 160 06/10/2012   TRIG 124.0 06/10/2012   HDL 33.40* 06/10/2012   LDLCALC 102* 06/10/2012   ALT 26 06/10/2012   AST 20 06/10/2012   NA 138 06/10/2012   K 4.2 06/10/2012   CL 102 06/10/2012   CREATININE 1.1 06/10/2012   BUN 12 06/10/2012   CO2 28 06/10/2012   TSH 2.32 06/10/2012   PSA 1.29 06/10/2012   INR 1.18 07/14/2009

## 2012-10-24 NOTE — Progress Notes (Signed)
Subjective:    Patient ID: Joseph Choi, male    DOB: 07/25/1960, 52 y.o.   MRN: [id number]  HPI  Here to c/o right sideneck pain, base of neck and upper back/shoulder now 7/10, worse in the AM, seems "deep", no other radiation, hard to abduct the right shoudler and turn head in the am, better later in the day when it seems to loosen up, overallgradually worsening, ongoing for 2 wks, has some minor soreness prior to that for several wks.  Did move a heavy bookcase 2 wks ago with 2 friends but no obvious injury at the time. Has to sleep on his back with the CPAP, uses 1 pillow, but not clear if related.  Has actually been a minor pain ongoing for years prior to this episode.  No RUE pain, weakness, numbness, gait change, falls, fever, trauma. Naproxen not helping. No recent xrays or imaging, or surgical eval.  No known prior sports injury.  Did have surgury to left foot with pin at first [facility] area per podiatry/dr regal.  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 Pt denies chest pain, increased sob or doe, wheezing, orthopnea, PND, increased LE swelling, palpitations, dizziness or syncope. Denies worsening depressive symptoms, suicidal ideation, or panic Past Medical History  Diagnosis Date  . ANXIETY 01/17/2007  . DEPRESSION 01/17/2007  . GERD 01/17/2007  . HYPERLIPIDEMIA 01/17/2007  . HYPERTENSION 01/17/2007  . LOW BACK PAIN 01/17/2007  . NECK PAIN, RIGHT 05/09/2010  . OBSTRUCTIVE SLEEP APNEA 01/17/2007  . URI 05/09/2010   Past Surgical History  Procedure Laterality Date  . S/p bilateral knee arthroscopy  2011    Dr. Darrelyn Hillock    reports that he has never smoked. He does not have any smokeless tobacco history on file. He reports that he does not drink alcohol or use illicit drugs. family history includes Diabetes in his other. No Known Allergies Current Outpatient Prescriptions on File Prior to Visit  Medication Sig Dispense Refill  .  amLODipine-benazepril (LOTREL) 5-20 MG per capsule Take 1 capsule by mouth daily.  90 capsule  3  . aspirin 81 MG tablet Take 81 mg by mouth daily.        . naproxen (NAPROSYN) 500 MG tablet Take 1 tablet (500 mg total) by mouth 2 (two) times daily with a meal.  60 tablet  2  . NON FORMULARY CPAP 13 advanced       No current facility-administered medications on file prior to visit.   Review of Systems  Constitutional: Negative for unexpected weight change, or unusual diaphoresis  HENT: Negative for tinnitus.   Eyes: Negative for photophobia and visual disturbance.  Respiratory: Negative for choking and stridor.   Gastrointestinal: Negative for vomiting and blood in stool.  Genitourinary: Negative for hematuria and decreased urine volume.  Musculoskeletal: Negative for acute joint swelling Skin: Negative for color change and wound.  Neurological: Negative for tremors and numbness other than noted  Psychiatric/Behavioral: Negative for decreased concentration or  hyperactivity.       Objective:   Physical Exam BP 132/82  Pulse 71  Temp(Src) 99 F (37.2 C) (Oral)  Ht 5\' 8"  (1.727 m)  Wt 311 lb 4 oz (141.182 kg)  BMI 47.34 kg/m2  SpO2 95% VS noted,  Constitutional: Pt appears well-developed and well-nourished.  HENT: Head: NCAT.  Right Ear: External ear normal.  Left Ear: External ear normal.  Eyes: Conjunctivae and EOM are normal.  Pupils are equal, round, and reactive to light.  Neck: Normal range of motion. Neck supple.  Cardiovascular: Normal rate and regular rhythm.   Pulmonary/Chest: Effort normal and breath sounds normal.  Abd:  Soft, NT, non-distended, + BS Neurological: Pt is alert. Not confused , motor/sens/dtr intact, gait intact Spine nontender except for approx c7 with right paravertebral localized tender without swelling, rash Skin: Skin is warm. No erythema.  Psychiatric: Pt behavior is normal. Thought content normal. Not nervous appearing or depressed affect      Assessment & Plan:

## 2012-10-24 NOTE — Patient Instructions (Signed)
Please take all new medication as prescribed - the pain medicaiton, and prednisone Please continue all other medications as before, and refills have been done if requested. Please have the pharmacy call with any other refills you may need. Please go to the XRAY Department in the Basement (go straight as you get off the elevator) for the x-ray testing You will be contacted by phone if any changes need to be made immediately.  Otherwise, you will receive a letter about your results with an explanation, but please check with MyChart first.  Please remember to sign up for My Chart if you have not done so, as this will be important to you in the future with finding out test results, communicating by private email, and scheduling acute appointments online when needed.

## 2012-10-24 NOTE — Assessment & Plan Note (Signed)
stable overall by history and exam, recent data reviewed with pt, and pt to continue medical treatment as before,  to f/u any worsening symptoms or concerns BP Readings from Last 3 Encounters:  10/24/12 132/82  06/10/12 140/86  05/09/10 166/102

## 2012-10-24 NOTE — Assessment & Plan Note (Signed)
Lumbar film 2006 with normal, but I suspect given may have underlying c-spine djd or ddd with flare of pain due to recent activity;  For pain med, predpack, and plain films given the unusual nature for him;  Neuro o/w ok, do not feel MRI or surgury needed at this time

## 2013-01-23 ENCOUNTER — Other Ambulatory Visit: Payer: Self-pay

## 2013-07-22 ENCOUNTER — Other Ambulatory Visit: Payer: Self-pay | Admitting: Internal Medicine

## 2013-12-09 ENCOUNTER — Encounter: Payer: Self-pay | Admitting: Internal Medicine

## 2013-12-09 ENCOUNTER — Ambulatory Visit (INDEPENDENT_AMBULATORY_CARE_PROVIDER_SITE_OTHER): Payer: BC Managed Care – PPO | Admitting: Internal Medicine

## 2013-12-09 ENCOUNTER — Other Ambulatory Visit (INDEPENDENT_AMBULATORY_CARE_PROVIDER_SITE_OTHER): Payer: BC Managed Care – PPO

## 2013-12-09 VITALS — BP 122/80 | HR 67 | Temp 98.4°F | Ht 68.5 in | Wt 303.5 lb

## 2013-12-09 DIAGNOSIS — Z23 Encounter for immunization: Secondary | ICD-10-CM

## 2013-12-09 DIAGNOSIS — IMO0001 Reserved for inherently not codable concepts without codable children: Secondary | ICD-10-CM | POA: Insufficient documentation

## 2013-12-09 DIAGNOSIS — Z0000 Encounter for general adult medical examination without abnormal findings: Secondary | ICD-10-CM

## 2013-12-09 DIAGNOSIS — IMO0002 Reserved for concepts with insufficient information to code with codable children: Secondary | ICD-10-CM

## 2013-12-09 LAB — BASIC METABOLIC PANEL
BUN: 13 mg/dL (ref 6–23)
CHLORIDE: 105 meq/L (ref 96–112)
CO2: 29 meq/L (ref 19–32)
CREATININE: 1.2 mg/dL (ref 0.4–1.5)
Calcium: 9.4 mg/dL (ref 8.4–10.5)
GFR: 82.3 mL/min (ref >60.00)
GLUCOSE: 85 mg/dL (ref 70–99)
Potassium: 4.2 mEq/L (ref 3.5–5.1)
Sodium: 137 mEq/L (ref 135–145)

## 2013-12-09 LAB — URINALYSIS, ROUTINE W REFLEX MICROSCOPIC
BILIRUBIN URINE: NEGATIVE
Hgb urine dipstick: NEGATIVE
KETONES UR: NEGATIVE
Leukocytes, UA: NEGATIVE
NITRITE: NEGATIVE
PH: 7 (ref 5.0–8.0)
RBC / HPF: NONE SEEN (ref 0–?)
Specific Gravity, Urine: 1.01 (ref 1.000–1.030)
TOTAL PROTEIN, URINE-UPE24: NEGATIVE
URINE GLUCOSE: NEGATIVE
Urobilinogen, UA: 0.2 (ref 0.0–1.0)
WBC, UA: NONE SEEN (ref 0–?)

## 2013-12-09 LAB — LIPID PANEL
CHOL/HDL RATIO: 5
Cholesterol: 150 mg/dL (ref 0–200)
HDL: 31.9 mg/dL — AB (ref >39.00)
LDL CALC: 96 mg/dL (ref 0–99)
NONHDL: 118.1
TRIGLYCERIDES: 111 mg/dL (ref 0.0–149.0)
VLDL: 22.2 mg/dL (ref 0.0–40.0)

## 2013-12-09 LAB — TSH: TSH: 2.82 u[IU]/mL (ref 0.35–4.50)

## 2013-12-09 LAB — HEPATIC FUNCTION PANEL
ALBUMIN: 4.2 g/dL (ref 3.5–5.2)
ALT: 24 U/L (ref 0–53)
AST: 25 U/L (ref 0–37)
Alkaline Phosphatase: 109 U/L (ref 39–117)
BILIRUBIN DIRECT: 0.2 mg/dL (ref 0.0–0.3)
TOTAL PROTEIN: 8 g/dL (ref 6.0–8.3)
Total Bilirubin: 1 mg/dL (ref 0.2–1.2)

## 2013-12-09 LAB — CBC WITH DIFFERENTIAL/PLATELET
BASOS PCT: 0.3 % (ref 0.0–3.0)
Basophils Absolute: 0 10*3/uL (ref 0.0–0.1)
Eosinophils Absolute: 0.1 10*3/uL (ref 0.0–0.7)
Eosinophils Relative: 0.7 % (ref 0.0–5.0)
HEMATOCRIT: 44 % (ref 39.0–52.0)
HEMOGLOBIN: 14.7 g/dL (ref 13.0–17.0)
LYMPHS ABS: 1.8 10*3/uL (ref 0.7–4.0)
LYMPHS PCT: 17.4 % (ref 12.0–46.0)
MCHC: 33.5 g/dL (ref 30.0–36.0)
MCV: 87.3 fl (ref 78.0–100.0)
MONOS PCT: 5.9 % (ref 3.0–12.0)
Monocytes Absolute: 0.6 10*3/uL (ref 0.1–1.0)
NEUTROS ABS: 7.7 10*3/uL (ref 1.4–7.7)
Neutrophils Relative %: 75.7 % (ref 43.0–77.0)
Platelets: 223 10*3/uL (ref 150.0–400.0)
RBC: 5.03 Mil/uL (ref 4.22–5.81)
RDW: 13.5 % (ref 11.5–15.5)
WBC: 10.2 10*3/uL (ref 4.0–10.5)

## 2013-12-09 LAB — PSA: PSA: 1.04 ng/mL (ref 0.10–4.00)

## 2013-12-09 MED ORDER — NAPROXEN 500 MG PO TABS: 500.0000 mg | ORAL_TABLET | Freq: Two times a day (BID) | ORAL | Status: DC

## 2013-12-09 MED ORDER — TIZANIDINE HCL 4 MG PO TABS: 4.0000 mg | ORAL_TABLET | Freq: Four times a day (QID) | ORAL | Status: DC | PRN

## 2013-12-09 MED ORDER — PREDNISONE 10 MG PO TABS: ORAL_TABLET | ORAL | Status: DC

## 2013-12-09 NOTE — Assessment & Plan Note (Signed)
?   msk vs radicular - for pain control, muscle relaxer, predpac asd, consider seeing Dr Smith/sport med

## 2013-12-09 NOTE — Progress Notes (Signed)
Pre visit review using our clinic review tool, if applicable. No additional management support is needed unless otherwise documented below in the visit note. 

## 2013-12-09 NOTE — Patient Instructions (Addendum)
You had the flu shot today  You will be contacted regarding the referral for: colonoscopy  Please take all new medication as prescribed- the pain medication, muscle relaxer, and prednisone as directed  Please consider seeing Dr Smith/Sport medicine in this office if not improved in 1-2 wks  Please continue all other medications as before, and refills have been done if requested.  Please have the pharmacy call with any other refills you may need.  Please continue your efforts at being more active, low cholesterol diet, and weight control.  You are otherwise up to date with prevention measures today.  Please keep your appointments with your specialists as you may have planned  Please go to the LAB in the Basement (turn left off the elevator) for the tests to be done today  You will be contacted by phone if any changes need to be made immediately.  Otherwise, you will receive a letter about your results with an explanation, but please check with MyChart first.  Please remember to sign up for MyChart if you have not done so, as this will be important to you in the future with finding out test results, communicating by private email, and scheduling acute appointments online when needed.  Please return in 1 year for your yearly visit, or sooner if needed, with Lab testing done 3-5 days before

## 2013-12-09 NOTE — Assessment & Plan Note (Signed)

## 2013-12-09 NOTE — Progress Notes (Signed)
Subjective:    Patient ID: Joseph Choi, male    DOB: [birth year], 53 y.o.   MRN: [id number]  HPI  Here for wellness and f/u;  Overall doing ok;  Pt denies CP, worsening SOB, DOE, wheezing, orthopnea, PND, worsening LE edema, palpitations, dizziness or syncope.  Pt denies neurological change such as new headache, facial or extremity weakness.  Pt denies polydipsia, polyuria, or low sugar symptoms. Pt states overall good compliance with treatment and medications, good tolerability, and has been trying to follow lower cholesterol diet.  Pt denies worsening depressive symptoms, suicidal ideation or panic. No fever, night sweats, wt loss, loss of appetite, or other constitutional symptoms.  Pt states good ability with ADL's, has low fall risk, home safety reviewed and adequate, no other significant changes in hearing or vision, and was more active until one mo ago with loss of 30 lbs with more exercise, less fatty diet. Less active and gained 5 lbs in the past mo, though, due to recurrent right lower back pain, mild to mod, but persistently recurrent, with occas radiation to the RLQ area, burning but also sharp, worse with most movement and raising the right leg, better with lying down. No bowel or bladder change, fever, wt loss,  worsening LE pain/numbness/weakness, gait change or falls. Past Medical History  Diagnosis Date  . ANXIETY 01/17/2007  . DEPRESSION 01/17/2007  . GERD 01/17/2007  . HYPERLIPIDEMIA 01/17/2007  . HYPERTENSION 01/17/2007  . LOW BACK PAIN 01/17/2007  . NECK PAIN, RIGHT 05/09/2010  . OBSTRUCTIVE SLEEP APNEA 01/17/2007  . URI 05/09/2010   Past Surgical History  Procedure Laterality Date  . S/p bilateral knee arthroscopy  2011    Dr. Gladstone Lighter    reports that he has never smoked. He does not have any smokeless tobacco history on file. He reports that he does not drink alcohol or use illicit drugs. family history includes Diabetes in his other. No Known Allergies Current  Outpatient Prescriptions on File Prior to Visit  Medication Sig Dispense Refill  . amLODipine-benazepril (LOTREL) 5-20 MG per capsule take 1 capsule by mouth once daily  90 capsule  3  . aspirin 81 MG tablet Take 81 mg by mouth daily.        . naproxen (NAPROSYN) 500 MG tablet Take 1 tablet (500 mg total) by mouth 2 (two) times daily with a meal.  60 tablet  2  . NON FORMULARY CPAP 13 advanced      . traMADol (ULTRAM) 50 MG tablet Take 1 tablet (50 mg total) by mouth every 6 (six) hours as needed for pain.  60 tablet  1   No current facility-administered medications on file prior to visit.   Review of Systems Constitutional: Negative for increased diaphoresis, other activity, appetite or other siginficant weight change  HENT: Negative for worsening hearing loss, ear pain, facial swelling, mouth sores and neck stiffness.   Eyes: Negative for other worsening pain, redness or visual disturbance.  Respiratory: Negative for shortness of breath and wheezing.   Cardiovascular: Negative for chest pain and palpitations.  Gastrointestinal: Negative for diarrhea, blood in stool, abdominal distention or other pain Genitourinary: Negative for hematuria, flank pain or change in urine volume.  Musculoskeletal: Negative for myalgias or other joint complaints.  Skin: Negative for color change and wound.  Neurological: Negative for syncope and numbness. other than noted Hematological: Negative for adenopathy. or other swelling Psychiatric/Behavioral: Negative for hallucinations, self-injury, decreased concentration or other worsening agitation.  Objective:   Physical Exam BP 122/80  Pulse 67  Temp(Src) 98.4 F (36.9 C) (Oral)  Ht 5' 8.5" (1.74 m)  Wt 303 lb 8 oz (137.667 kg)  BMI 45.47 kg/m2  SpO2 97% VS noted,  Constitutional: Pt is oriented to person, place, and time. Appears well-developed and well-nourished.  Head: Normocephalic and atraumatic.  Right Ear: External ear normal.  Left Ear:  External ear normal.  Nose: Nose normal.  Mouth/Throat: Oropharynx is clear and moist.  Eyes: Conjunctivae and EOM are normal. Pupils are equal, round, and reactive to light.  Neck: Normal range of motion. Neck supple. No JVD present. No tracheal deviation present.  Cardiovascular: Normal rate, regular rhythm, normal heart sounds and intact distal pulses.   Pulmonary/Chest: Effort normal and breath sounds without rales or wheezing  Abdominal: Soft. Bowel sounds are normal. NT. No HSM  Musculoskeletal: Normal range of motion. Exhibits no edema.  Lymphadenopathy:  Has no cervical adenopathy.  Neurological: Pt is alert and oriented to person, place, and time. Pt has normal reflexes. No cranial nerve deficit. Motor grossly intact, sens/dtr intact Spine nontender, mild tender right lateral paravertebral area Skin: Skin is warm and dry. No rash noted.  Psychiatric:  Has normal mood and affect. Behavior is normal.     Assessment & Plan:

## 2013-12-30 ENCOUNTER — Encounter: Payer: Self-pay | Admitting: Internal Medicine

## 2014-01-08 ENCOUNTER — Encounter: Payer: Self-pay | Admitting: Internal Medicine

## 2014-08-03 ENCOUNTER — Other Ambulatory Visit: Payer: Self-pay | Admitting: Internal Medicine

## 2015-02-01 ENCOUNTER — Other Ambulatory Visit: Payer: Self-pay | Admitting: Internal Medicine

## 2015-02-01 ENCOUNTER — Other Ambulatory Visit: Payer: Self-pay

## 2015-02-01 MED ORDER — AMLODIPINE BESY-BENAZEPRIL HCL 5-20 MG PO CAPS: 1.0000 | ORAL_CAPSULE | Freq: Every day | ORAL | Status: DC

## 2015-10-22 ENCOUNTER — Emergency Department (HOSPITAL_COMMUNITY): Payer: BC Managed Care – PPO

## 2015-10-22 ENCOUNTER — Telehealth: Payer: Self-pay | Admitting: Internal Medicine

## 2015-10-22 ENCOUNTER — Encounter (HOSPITAL_COMMUNITY): Payer: Self-pay | Admitting: *Deleted

## 2015-10-22 ENCOUNTER — Emergency Department (HOSPITAL_COMMUNITY)
Admission: EM | Admit: 2015-10-22 | Discharge: 2015-10-22 | Disposition: A | Discharge status: Home / Self Care | Payer: BC Managed Care – PPO | Attending: Emergency Medicine | Admitting: Emergency Medicine

## 2015-10-22 DIAGNOSIS — I10 Essential (primary) hypertension: Secondary | ICD-10-CM | POA: Diagnosis not present

## 2015-10-22 DIAGNOSIS — R0789 Other chest pain: Secondary | ICD-10-CM | POA: Insufficient documentation

## 2015-10-22 DIAGNOSIS — R079 Chest pain, unspecified: Secondary | ICD-10-CM

## 2015-10-22 HISTORY — DX: Essential (primary) hypertension: I10

## 2015-10-22 LAB — BASIC METABOLIC PANEL
ANION GAP: 6 (ref 5–15)
BUN: 10 mg/dL (ref 6–20)
CALCIUM: 9.4 mg/dL (ref 8.9–10.3)
CO2: 23 mmol/L (ref 22–32)
Chloride: 107 mmol/L (ref 101–111)
Creatinine, Ser: 1.06 mg/dL (ref 0.61–1.24)
GLUCOSE: 88 mg/dL (ref 65–99)
POTASSIUM: 3.8 mmol/L (ref 3.5–5.1)
SODIUM: 136 mmol/L (ref 135–145)

## 2015-10-22 LAB — CBC
HCT: 45.3 % (ref 39.0–52.0)
Hemoglobin: 15.2 g/dL (ref 13.0–17.0)
MCH: 29.4 pg (ref 26.0–34.0)
MCHC: 33.6 g/dL (ref 30.0–36.0)
MCV: 87.6 fL (ref 78.0–100.0)
PLATELETS: 214 10*3/uL (ref 150–400)
RBC: 5.17 MIL/uL (ref 4.22–5.81)
RDW: 12.9 % (ref 11.5–15.5)
WBC: 8.1 10*3/uL (ref 4.0–10.5)

## 2015-10-22 LAB — TROPONIN I

## 2015-10-22 LAB — I-STAT TROPONIN, ED: TROPONIN I, POC: 0.01 ng/mL (ref 0.00–0.08)

## 2015-10-22 NOTE — ED Provider Notes (Signed)
[facility] DEPT Provider Note   CSN: [id number] Arrival date & time: 10/22/15  [id number]  First Provider Contact:  None       History   Chief Complaint Chief Complaint  Patient presents with  . Chest Pain    HPI Joseph Choi is a 55 y.o. male presents with chest pain which has been ongoing for the past 2 weeks. PMH significant for HTN, Dyslipidemia, OSA, GERD, anxiety/depression. He states that he was in [place] on vacation going for long walks (up to 6 mi a day) and then went to pick something up off the ground when he felt an acute onset of left sided chest pain. He states it is constant, worse with leaning forward and L arm movement, does not radiate, and it is non-exertional. Nothing has been making it better. He has been taking BC powder and Excedrin because he has been having a headache as well which "takes the edge off". He states he was hit in the head by a parking lot gate when he tried to walk under it and the next day he began to have a frontal headache. Denies LOC, vision changes, N/V, seizures. He states it is not the worse headache of his life but is a nagging pain that has not gone away. He states his chest pain has gotten "lighter" recently but wanted to get it checked out. He tried to go to his PCP's office but they referred him to the ED. He has had a stress test in the past which suggested ischemia and subsequently had a catheterization which showed normal coronaries per patient. Denies fever, chills, palpiatations, leg swelling, SOB, cough, abdominal pain.    HPI  Past Medical History:  Diagnosis Date  . ANXIETY 01/17/2007  . DEPRESSION 01/17/2007  . GERD 01/17/2007  . HYPERLIPIDEMIA 01/17/2007  . HYPERTENSION 01/17/2007  . Hypertension   . LOW BACK PAIN 01/17/2007  . NECK PAIN, RIGHT 05/09/2010  . OBSTRUCTIVE SLEEP APNEA 01/17/2007  . URI 05/09/2010    Patient Active Problem List   Diagnosis Date Noted  . Radicular pain of right lower back 12/09/2013  .  Neck pain on right side 10/24/2012  . Preventative health care 06/10/2012  . Left shoulder pain 06/10/2012  . HYPERLIPIDEMIA 01/17/2007  . ANXIETY 01/17/2007  . DEPRESSION 01/17/2007  . OBSTRUCTIVE SLEEP APNEA 01/17/2007  . HYPERTENSION 01/17/2007  . GERD 01/17/2007    Past Surgical History:  Procedure Laterality Date  . s/p bilateral knee arthroscopy  2011   Dr. Gladstone Lighter      Home Medications    Prior to Admission medications   Medication Sig Start Date End Date Taking? Authorizing Provider  amLODipine-benazepril (LOTREL) 5-20 MG capsule take 1 capsule by mouth once daily 02/01/15   Biagio Borg, MD  amLODipine-benazepril (LOTREL) 5-20 MG capsule Take 1 capsule by mouth daily. 02/01/15   Biagio Borg, MD  aspirin 81 MG tablet Take 81 mg by mouth daily.      Historical Provider, MD  naproxen (NAPROSYN) 500 MG tablet Take 1 tablet (500 mg total) by mouth 2 (two) times daily with a meal. 12/09/13   Biagio Borg, MD  NON FORMULARY CPAP 13 advanced    Historical Provider, MD  predniSONE (DELTASONE) 10 MG tablet 3 tabs by mouth per day for 3 days,2tabs per day for 3 days,1tab per day for 3 days 12/09/13   Biagio Borg, MD  tiZANidine (ZANAFLEX) 4 MG tablet Take 1 tablet (4 mg total) by  mouth every 6 (six) hours as needed for muscle spasms. 12/09/13   Biagio Borg, MD  traMADol (ULTRAM) 50 MG tablet Take 1 tablet (50 mg total) by mouth every 6 (six) hours as needed for pain. 10/24/12   Biagio Borg, MD    Family History Family History  Problem Relation Age of Onset  . Diabetes Other     Social History Social History  Substance Use Topics  . Smoking status: Never Smoker  . Smokeless tobacco: Never Used  . Alcohol use No     Allergies   Review of patient's allergies indicates no known allergies.   Review of Systems Review of Systems  Constitutional: Negative for fever.  Respiratory: Negative for cough and shortness of breath.   Cardiovascular: Positive for chest pain.  Negative for palpitations and leg swelling.  Gastrointestinal: Negative for abdominal pain, nausea and vomiting.  Neurological: Positive for headaches. Negative for dizziness, seizures, syncope and weakness.    Physical Exam Updated Vital Signs BP 140/78 (BP Location: Right Arm)   Pulse 70   Temp 98.2 F (36.8 C) (Oral)   Resp 20   Wt (!) 136.4 kg   SpO2 99%   BMI 45.07 kg/m   Physical Exam  Constitutional: He is oriented to person, place, and time. He appears well-developed and well-nourished. No distress.  Pleasant, obese male  HENT:  Head: Normocephalic and atraumatic. Head is without raccoon's eyes and without Battle's sign.  Nose: No epistaxis.  Eyes: Conjunctivae are normal.  Neck: Neck supple.  Cardiovascular: Normal rate, regular rhythm and intact distal pulses.  Exam reveals no gallop and no friction rub.   No murmur heard. Pulmonary/Chest: Effort normal and breath sounds normal. No respiratory distress. He has no wheezes. He has no rales. He exhibits no tenderness.  Abdominal: Soft. Bowel sounds are normal. He exhibits no distension and no mass. There is no tenderness. There is no rebound and no guarding. No hernia.  Musculoskeletal: He exhibits no edema.  Neurological: He is alert and oriented to person, place, and time. No cranial nerve deficit.  Skin: Skin is warm and dry. He is not diaphoretic.  Psychiatric: He has a normal mood and affect.  Nursing note and vitals reviewed.    ED Treatments / Results  Labs (all labs ordered are listed, but only abnormal results are displayed) Labs Reviewed  BASIC METABOLIC PANEL  CBC  I-STAT [place], ED    EKG  EKG Interpretation  Date/Time:  Friday October 22 2015 09:43:58 EDT Ventricular Rate:  70 PR Interval:  188 QRS Duration: 94 QT Interval:  386 QTC Calculation: 416 R Axis:   45 Text Interpretation:  Normal sinus rhythm Possible Lateral infarct , age undetermined Possible Inferior infarct , age  undetermined Abnormal ECG Since last tracing in 2005, no changes Confirmed by Sabra Heck  MD, [place] ([id number]) on 10/22/2015 10:12:51 AM       Radiology Dg Chest 2 View  Result Date: 10/22/2015 CLINICAL DATA:  Chest tightness for 1 week. EXAM: CHEST  2 VIEW COMPARISON:  One-view chest x-ray 07/05/2003 FINDINGS: The heart size is normal. Lung volumes are somewhat low. No focal airspace disease is present. There is no edema or effusion to suggest failure. The visualized soft tissues and bony thorax are unremarkable. IMPRESSION: 1. No acute cardiopulmonary disease. Electronically Signed   By: San Morelle M.D.   On: 10/22/2015 10:48    Procedures Procedures (including critical care time)  Medications Ordered in ED Medications - No  data to display   Initial Impression / Assessment and Plan / ED Course  I have reviewed the triage vital signs and the nursing notes.  Pertinent labs & imaging results that were available during my care of the patient were reviewed by me and considered in my medical decision making (see chart for details).  Clinical Course  Comment By Time  The patient is 55 years old, he presents with chest pain which is been present for several days since he was vacationing in [place]. During that time he walked for 6 miles, when he bent over at the gym to lift something up he developed a pain in the left side of his chest which has essentially been constant since that time. Gradually improving, he has been able to walk 6 miles per day 2 times after it started and this does not make the pain worse. There is no radiation, no shortness of breath, he does have mild bilateral peripheral edema but no significant asymmetry. EKG is unremarkable and unchanged since 2005, lab work is normal, doubt cardiac pathology, second troponin ordered as per the heart pathway guidelines with a score of 3. Anticipate discharge. Noemi Chapel, MD 08/1 6521   55 year old male presents with chest pain. Pain  is atypical. Unlikely this is ACS. Other than mild HTN vitals are WNL and stable. Chest pain work up is reassuring. EKG is NSR and shows no significant change since last. CXR is negative. Initial Troponin is 0. Labs are unremarkable. No significant personal or family hx of cardiac disease. Patient is non-smoker. HEART score is 3. Plan to d/c with close follow up with cardiology for possible stress testing.   Shared visit with Dr. Sabra Heck. Patient is NAD, non-toxic, with stable VS. Patient is informed of clinical course, understands medical decision making process, and agrees with plan. Opportunity for questions provided and all questions answered. Return precautions given.   Final Clinical Impressions(s) / ED Diagnoses   Final diagnoses:  Chest pain, unspecified chest pain type    New Prescriptions New Prescriptions   No medications on file     Recardo Evangelist, PA-C 10/22/15 1432    Noemi Chapel, MD 10/23/15 [phone]

## 2015-10-22 NOTE — Telephone Encounter (Signed)
Called pt and LMOVM to inform of PCP recommendations.

## 2015-10-22 NOTE — Telephone Encounter (Signed)
[facility] Patient Name: Joseph Choi DOB: 01-25-1961 Initial Comment chest pain, feels pressure/tightness, inhale/exhale feels it more on the left breast area, having headaches Nurse Assessment Nurse: Markus Daft, RN, [facility] Poot Date/Time (Eastern Time): 10/22/2015 8:46:35 AM Confirm and document reason for call. If symptomatic, describe symptoms. You must click the next button to save text entered. ---Caller states that he has had left sided chest pain, feels pressure/tightness, inhale/exhale feels it more on the left breast area. He is also having headaches. He feels it when he moves a certain way, and it is mild. S/S started 09/14/2015 CP started after he finished walking 5 miles and has cont. to walk every since. Within a day or two of the start of CP, he c/o HA. Has the patient traveled out of the country within the last 30 days? ---Not Applicable Does the patient have any new or worsening symptoms? ---Yes Will a triage be completed? ---Yes Related visit to physician within the last 2 weeks? ---No Does the PT have any chronic conditions? (i.e. diabetes, asthma, etc.) ---Yes List chronic conditions. ---HTN Is this a behavioral health or substance abuse call? ---No Guidelines Guideline Title Affirmed Question Affirmed Notes Chest Pain Recent long-distance travel with prolonged time in car, bus, plane, or train (i.e., within past 2 weeks; 6 or more hours duration) Final Disposition User Go to ED Now Markus Daft, RN, [name] Comments States that the CP is felt when he moves to the left or right, and bending over for example. He doesn't want to go to ER, office please call him. He has returned from a recent trip to [place] where he was in the car for 10 hrs. Office notified that they will need to call him back today. Referrals GO TO FACILITY REFUSED Disagree/Comply: Disagree Disagree/Comply Reason: Disagree  with instructions

## 2015-10-22 NOTE — Telephone Encounter (Signed)
Ok to advise go to UC or ER, o/w I have nothing else to offer

## 2015-10-22 NOTE — Telephone Encounter (Signed)
Please advise 

## 2015-10-22 NOTE — ED Triage Notes (Signed)
Pt c/o L sided non radiating CP onset x12 days ago worse with L arm movement, pt reports improvement with pain when walking, pt denies SOB, n/v/d, hx of HTN, A&O x4

## 2015-10-22 NOTE — Telephone Encounter (Signed)
See below

## 2015-10-22 NOTE — Telephone Encounter (Signed)
Nurse called to report about this and see what Dr. Jenny Reichmann wants to do since pt refuse to go to the ER and want to be seen in the office. Please call pt

## 2015-10-27 ENCOUNTER — Other Ambulatory Visit (INDEPENDENT_AMBULATORY_CARE_PROVIDER_SITE_OTHER): Payer: BC Managed Care – PPO

## 2015-10-27 ENCOUNTER — Ambulatory Visit (INDEPENDENT_AMBULATORY_CARE_PROVIDER_SITE_OTHER): Payer: BC Managed Care – PPO | Admitting: Internal Medicine

## 2015-10-27 VITALS — BP 150/98 | HR 62 | Temp 98.4°F | Resp 16 | Ht 69.0 in | Wt 303.0 lb

## 2015-10-27 DIAGNOSIS — Z1159 Encounter for screening for other viral diseases: Secondary | ICD-10-CM

## 2015-10-27 DIAGNOSIS — I10 Essential (primary) hypertension: Secondary | ICD-10-CM | POA: Diagnosis not present

## 2015-10-27 DIAGNOSIS — Z1211 Encounter for screening for malignant neoplasm of colon: Secondary | ICD-10-CM

## 2015-10-27 DIAGNOSIS — R6889 Other general symptoms and signs: Secondary | ICD-10-CM

## 2015-10-27 DIAGNOSIS — R079 Chest pain, unspecified: Secondary | ICD-10-CM | POA: Insufficient documentation

## 2015-10-27 DIAGNOSIS — Z0001 Encounter for general adult medical examination with abnormal findings: Secondary | ICD-10-CM

## 2015-10-27 DIAGNOSIS — E785 Hyperlipidemia, unspecified: Secondary | ICD-10-CM | POA: Diagnosis not present

## 2015-10-27 DIAGNOSIS — R51 Headache: Secondary | ICD-10-CM

## 2015-10-27 DIAGNOSIS — R519 Headache, unspecified: Secondary | ICD-10-CM | POA: Insufficient documentation

## 2015-10-27 LAB — URINALYSIS, ROUTINE W REFLEX MICROSCOPIC
Bilirubin Urine: NEGATIVE
Hgb urine dipstick: NEGATIVE
KETONES UR: NEGATIVE
Leukocytes, UA: NEGATIVE
Nitrite: NEGATIVE
PH: 7.5 (ref 5.0–8.0)
RBC / HPF: NONE SEEN (ref 0–?)
SPECIFIC GRAVITY, URINE: 1.015 (ref 1.000–1.030)
TOTAL PROTEIN, URINE-UPE24: NEGATIVE
URINE GLUCOSE: NEGATIVE
UROBILINOGEN UA: 0.2 (ref 0.0–1.0)
WBC, UA: NONE SEEN (ref 0–?)

## 2015-10-27 LAB — LIPID PANEL
CHOL/HDL RATIO: 4
Cholesterol: 168 mg/dL (ref 0–200)
HDL: 38.3 mg/dL — ABNORMAL LOW (ref >39.00)
LDL CALC: 104 mg/dL — AB (ref 0–99)
NonHDL: 129.63
TRIGLYCERIDES: 129 mg/dL (ref 0.0–149.0)
VLDL: 25.8 mg/dL (ref 0.0–40.0)

## 2015-10-27 LAB — PSA: PSA: 1.37 ng/mL (ref 0.10–4.00)

## 2015-10-27 LAB — TSH: TSH: 3.82 u[IU]/mL (ref 0.35–4.50)

## 2015-10-27 MED ORDER — AMLODIPINE BESY-BENAZEPRIL HCL 5-20 MG PO CAPS: 1.0000 | ORAL_CAPSULE | Freq: Every day | ORAL | 3 refills | Status: DC

## 2015-10-27 NOTE — Assessment & Plan Note (Addendum)
Recent ecg reviewed, declines repeat, asympt, cont to follow  In addition to the time spent performing CPE, I spent an additional 25 minutes face to face,in which greater than 50% of this time was spent in counseling and coordination of care for patient's acute illness as documented.

## 2015-10-27 NOTE — Assessment & Plan Note (Signed)
Uncontrolled, to restart med, o/w stable overall by history and exam, recent data reviewed with pt, and pt to continue medical treatment as before,  to f/u any worsening symptoms or concerns BP Readings from Last 3 Encounters:  10/27/15 (!) 150/98  10/22/15 131/75  12/09/13 122/80

## 2015-10-27 NOTE — Assessment & Plan Note (Signed)
stable overall by history and exam, recent data reviewed with pt, and pt to continue medical treatment as before,  to f/u any worsening symptoms or concerns Lab Results  Component Value Date   LDLCALC 104 (H) 10/27/2015
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
## 2015-10-27 NOTE — Patient Instructions (Addendum)
OK to take advill or alleve as needed for pain  Please continue all other medications as before, and refills have been done if requested.  Please have the pharmacy call with any other refills you may need.  Please continue your efforts at being more active, low cholesterol diet, and weight control.  You are otherwise up to date with prevention measures today.  Please keep your appointments with your specialists as you may have planned  You will be contacted regarding the referral for: colonoscopy  Please go to the LAB in the Basement (turn left off the elevator) for the tests to be done today, including the Hep C test  You will be contacted by phone if any changes need to be made immediately.  Otherwise, you will receive a letter about your results with an explanation, but please check with MyChart first.  Please remember to sign up for MyChart if you have not done so, as this will be important to you in the future with finding out test results, communicating by private email, and scheduling acute appointments online when needed.  Please return in 1 year for your yearly visit, or sooner if needed, with Lab testing done 3-5 days before

## 2015-10-27 NOTE — Progress Notes (Signed)
Pre visit review using our clinic review tool, if applicable. No additional management support is needed unless otherwise documented below in the visit note. 

## 2015-10-27 NOTE — Assessment & Plan Note (Signed)
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
## 2015-10-28 LAB — HEPATITIS C ANTIBODY: HCV Ab: NEGATIVE

## 2016-01-14 ENCOUNTER — Encounter: Payer: Self-pay | Admitting: Internal Medicine

## 2016-12-04 ENCOUNTER — Other Ambulatory Visit: Payer: Self-pay | Admitting: Internal Medicine

## 2016-12-07 ENCOUNTER — Encounter: Payer: Self-pay | Admitting: Internal Medicine

## 2016-12-07 ENCOUNTER — Ambulatory Visit (INDEPENDENT_AMBULATORY_CARE_PROVIDER_SITE_OTHER): Payer: BC Managed Care – PPO | Admitting: Internal Medicine

## 2016-12-07 ENCOUNTER — Other Ambulatory Visit (INDEPENDENT_AMBULATORY_CARE_PROVIDER_SITE_OTHER): Payer: BC Managed Care – PPO

## 2016-12-07 VITALS — BP 126/88 | HR 66 | Temp 98.4°F | Ht 69.0 in | Wt 293.0 lb

## 2016-12-07 DIAGNOSIS — Z0001 Encounter for general adult medical examination with abnormal findings: Secondary | ICD-10-CM

## 2016-12-07 DIAGNOSIS — Z0000 Encounter for general adult medical examination without abnormal findings: Secondary | ICD-10-CM

## 2016-12-07 DIAGNOSIS — Z23 Encounter for immunization: Secondary | ICD-10-CM | POA: Diagnosis not present

## 2016-12-07 DIAGNOSIS — Z114 Encounter for screening for human immunodeficiency virus [HIV]: Secondary | ICD-10-CM

## 2016-12-07 DIAGNOSIS — I10 Essential (primary) hypertension: Secondary | ICD-10-CM | POA: Diagnosis not present

## 2016-12-07 LAB — LIPID PANEL
CHOL/HDL RATIO: 4
Cholesterol: 158 mg/dL (ref 0–200)
HDL: 42.5 mg/dL (ref >39.00)
LDL Cholesterol: 94 mg/dL (ref 0–99)
NONHDL: 115.25
Triglycerides: 104 mg/dL (ref 0.0–149.0)
VLDL: 20.8 mg/dL (ref 0.0–40.0)

## 2016-12-07 LAB — HEPATIC FUNCTION PANEL
ALT: 19 U/L (ref 0–53)
AST: 23 U/L (ref 0–37)
Albumin: 4.2 g/dL (ref 3.5–5.2)
Alkaline Phosphatase: 99 U/L (ref 39–117)
BILIRUBIN DIRECT: 0.2 mg/dL (ref 0.0–0.3)
BILIRUBIN TOTAL: 0.8 mg/dL (ref 0.2–1.2)
Total Protein: 7.3 g/dL (ref 6.0–8.3)

## 2016-12-07 LAB — URINALYSIS, ROUTINE W REFLEX MICROSCOPIC
Bilirubin Urine: NEGATIVE
Hgb urine dipstick: NEGATIVE
Ketones, ur: NEGATIVE
Leukocytes, UA: NEGATIVE
Nitrite: NEGATIVE
RBC / HPF: NONE SEEN (ref 0–?)
SPECIFIC GRAVITY, URINE: 1.015 (ref 1.000–1.030)
Total Protein, Urine: NEGATIVE
Urine Glucose: NEGATIVE
Urobilinogen, UA: 0.2 (ref 0.0–1.0)
pH: 8 (ref 5.0–8.0)

## 2016-12-07 LAB — CBC WITH DIFFERENTIAL/PLATELET
BASOS PCT: 0.8 % (ref 0.0–3.0)
Basophils Absolute: 0.1 10*3/uL (ref 0.0–0.1)
EOS ABS: 0.1 10*3/uL (ref 0.0–0.7)
Eosinophils Relative: 0.8 % (ref 0.0–5.0)
HEMATOCRIT: 44.6 % (ref 39.0–52.0)
Hemoglobin: 14.7 g/dL (ref 13.0–17.0)
LYMPHS PCT: 20.2 % (ref 12.0–46.0)
Lymphs Abs: 1.5 10*3/uL (ref 0.7–4.0)
MCHC: 33 g/dL (ref 30.0–36.0)
MCV: 89.2 fl (ref 78.0–100.0)
Monocytes Absolute: 0.4 10*3/uL (ref 0.1–1.0)
Monocytes Relative: 5.9 % (ref 3.0–12.0)
NEUTROS ABS: 5.4 10*3/uL (ref 1.4–7.7)
Neutrophils Relative %: 72.3 % (ref 43.0–77.0)
PLATELETS: 230 10*3/uL (ref 150.0–400.0)
RBC: 5 Mil/uL (ref 4.22–5.81)
RDW: 13.6 % (ref 11.5–15.5)
WBC: 7.5 10*3/uL (ref 4.0–10.5)

## 2016-12-07 LAB — TSH: TSH: 2.28 u[IU]/mL (ref 0.35–4.50)

## 2016-12-07 LAB — BASIC METABOLIC PANEL
BUN: 12 mg/dL (ref 6–23)
CHLORIDE: 106 meq/L (ref 96–112)
CO2: 27 mEq/L (ref 19–32)
Calcium: 9.5 mg/dL (ref 8.4–10.5)
Creatinine, Ser: 1.16 mg/dL (ref 0.40–1.50)
GFR: 83.05 mL/min (ref >60.00)
Glucose, Bld: 88 mg/dL (ref 70–99)
POTASSIUM: 4 meq/L (ref 3.5–5.1)
SODIUM: 140 meq/L (ref 135–145)

## 2016-12-07 LAB — PSA: PSA: 1.06 ng/mL (ref 0.10–4.00)

## 2016-12-07 MED ORDER — AMLODIPINE BESY-BENAZEPRIL HCL 5-20 MG PO CAPS: 1.0000 | ORAL_CAPSULE | Freq: Every day | ORAL | 3 refills | Status: DC

## 2016-12-07 NOTE — Assessment & Plan Note (Signed)
stable overall by history and exam, recent data reviewed with pt, and pt to continue medical treatment as before,  to f/u any worsening symptoms or concerns  

## 2016-12-07 NOTE — Progress Notes (Signed)
Subjective:    Patient ID: Joseph Choi, male    DOB: 06/01/1960, 56 y.o.   MRN: [id number]  HPI  Here for wellness and f/u;  Overall doing ok;  Pt denies Chest pain, worsening SOB, DOE, wheezing, orthopnea, PND, worsening LE edema, palpitations, dizziness or syncope.  Pt denies neurological change such as new headache, facial or extremity weakness.  Pt denies polydipsia, polyuria, or low sugar symptoms. Pt states overall good compliance with treatment and medications, good tolerability, and has been trying to follow appropriate diet.  Pt denies worsening depressive symptoms, suicidal ideation or panic. No fever, night sweats, wt loss, loss of appetite, or other constitutional symptoms.  Pt states good ability with ADL's, has low fall risk, home safety reviewed and adequate, no other significant changes in hearing or vision, and tyring to remain active with exercise with walking outside at work and on the weekends. Wt down 10 lbs with better diet and more exercise. Never married after fiancee died 1 yrs ago.   Wt Readings from Last 3 Encounters:  12/07/16 293 lb (132.9 kg)  10/27/15 (!) 303 lb (137.4 kg)  10/22/15 (!) 300 lb 12.8 oz (136.4 kg)   Past Medical History:  Diagnosis Date  . ANXIETY 01/17/2007  . DEPRESSION 01/17/2007  . GERD 01/17/2007  . HYPERLIPIDEMIA 01/17/2007  . HYPERTENSION 01/17/2007  . Hypertension   . LOW BACK PAIN 01/17/2007  . NECK PAIN, RIGHT 05/09/2010  . OBSTRUCTIVE SLEEP APNEA 01/17/2007  . URI 05/09/2010   Past Surgical History:  Procedure Laterality Date  . s/p bilateral knee arthroscopy  2011   Dr. Gladstone Lighter    reports that he has never smoked. He has never used smokeless tobacco. He reports that he does not drink alcohol or use drugs. family history includes Diabetes in his other. No Known Allergies Current Outpatient Prescriptions on File Prior to Visit  Medication Sig Dispense Refill  . aspirin 81 MG tablet Take 81 mg by mouth daily.      Marland Kitchen  aspirin-acetaminophen-caffeine (EXCEDRIN EXTRA STRENGTH) 250-250-65 MG tablet Take 1-3 tablets by mouth every 6 (six) hours as needed for headache.    . NON FORMULARY CPAP 13 advanced     No current facility-administered medications on file prior to visit.    Review of Systems Constitutional: Negative for other unusual diaphoresis, sweats, appetite or weight changes HENT: Negative for other worsening hearing loss, ear pain, facial swelling, mouth sores or neck stiffness.   Eyes: Negative for other worsening pain, redness or other visual disturbance.  Respiratory: Negative for other stridor or swelling Cardiovascular: Negative for other palpitations or other chest pain  Gastrointestinal: Negative for worsening diarrhea or loose stools, blood in stool, distention or other pain Genitourinary: Negative for hematuria, flank pain or other change in urine volume.  Musculoskeletal: Negative for myalgias or other joint swelling.  Skin: Negative for other color change, or other wound or worsening drainage.  Neurological: Negative for other syncope or numbness. Hematological: Negative for other adenopathy or swelling Psychiatric/Behavioral: Negative for hallucinations, other worsening agitation, SI, self-injury, or new decreased concentration All other system neg per pt    Objective:   Physical Exam BP 126/88   Pulse 66   Temp 98.4 F (36.9 C) (Oral)   Ht 5\' 9"  (1.753 m)   Wt 293 lb (132.9 kg)   SpO2 99%   BMI 43.27 kg/m  VS noted,  Constitutional: Pt is oriented to person, place, and time. Appears well-developed and well-nourished, in no  significant distress and comfortable Head: Normocephalic and atraumatic  Eyes: Conjunctivae and EOM are normal. Pupils are equal, round, and reactive to light Right Ear: External ear normal without discharge Left Ear: External ear normal without discharge Nose: Nose without discharge or deformity Mouth/Throat: Oropharynx is without other ulcerations and  moist  Neck: Normal range of motion. Neck supple. No JVD present. No tracheal deviation present or significant neck LA or mass Cardiovascular: Normal rate, regular rhythm, normal heart sounds and intact distal pulses.   Pulmonary/Chest: WOB normal and breath sounds without rales or wheezing  Abdominal: Soft. Bowel sounds are normal. NT. No HSM  Musculoskeletal: Normal range of motion. Exhibits no edema Lymphadenopathy: Has no other cervical adenopathy.  Neurological: Pt is alert and oriented to person, place, and time. Pt has normal reflexes. No cranial nerve deficit. Motor grossly intact, Gait intact Skin: Skin is warm and dry. No rash noted or new ulcerations Psychiatric:  Has normal mood and affect. Behavior is normal without agitation No other exam findings Lab Results  Component Value Date   WBC 8.1 10/22/2015   HGB 15.2 10/22/2015   HCT 45.3 10/22/2015   PLT 214 10/22/2015   GLUCOSE 88 10/22/2015   CHOL 168 10/27/2015   TRIG 129.0 10/27/2015   HDL 38.30 (L) 10/27/2015   LDLCALC 104 (H) 10/27/2015   ALT 24 12/09/2013   AST 25 12/09/2013   NA 136 10/22/2015   K 3.8 10/22/2015   CL 107 10/22/2015   CREATININE 1.06 10/22/2015   BUN 10 10/22/2015   CO2 23 10/22/2015   TSH 3.82 10/27/2015   PSA 1.37 10/27/2015   INR 1.18 07/14/2009       Assessment & Plan:

## 2016-12-07 NOTE — Patient Instructions (Addendum)
You had the flu shot today  You will be contacted regarding the referral for: colonoscopy (near xmas holidays if possible - from dec 20 to end or year If possible)  Please continue all other medications as before, and refills have been done if requested.  Please have the pharmacy call with any other refills you may need.  Please continue your efforts at being more active, low cholesterol diet, and weight control.  You are otherwise up to date with prevention measures today.  Please keep your appointments with your specialists as you may have planned  Please go to the LAB in the Basement (turn left off the elevator) for the tests to be done today  You will be contacted by phone if any changes need to be made immediately.  Otherwise, you will receive a letter about your results with an explanation, but please check with MyChart first.  Please remember to sign up for MyChart if you have not done so, as this will be important to you in the future with finding out test results, communicating by private email, and scheduling acute appointments online when needed.  Please return in 1 year for your yearly visit, or sooner if needed, with Lab testing done 3-5 days before

## 2016-12-07 NOTE — Assessment & Plan Note (Signed)

## 2016-12-08 LAB — HIV ANTIBODY (ROUTINE TESTING W REFLEX): HIV 1&2 Ab, 4th Generation: NONREACTIVE

## 2017-01-17 ENCOUNTER — Encounter: Payer: Self-pay | Admitting: Gastroenterology

## 2017-03-05 ENCOUNTER — Other Ambulatory Visit: Payer: Self-pay

## 2017-03-05 ENCOUNTER — Ambulatory Visit (AMBULATORY_SURGERY_CENTER): Payer: Self-pay

## 2017-03-05 ENCOUNTER — Encounter: Payer: Self-pay | Admitting: Gastroenterology

## 2017-03-05 VITALS — Ht 68.0 in | Wt 287.4 lb

## 2017-03-05 DIAGNOSIS — Z1211 Encounter for screening for malignant neoplasm of colon: Secondary | ICD-10-CM

## 2017-03-05 MED ORDER — NA SULFATE-K SULFATE-MG SULF 17.5-3.13-1.6 GM/177ML PO SOLN: 1.0000 | Freq: Once | ORAL | 0 refills | Status: AC

## 2017-03-05 NOTE — Progress Notes (Signed)
Denies allergies to eggs or soy products. Denies complication of anesthesia or sedation. Denies use of weight loss medication. Denies use of O2.   Emmi instructions given for colonoscopy.  

## 2017-03-12 ENCOUNTER — Encounter: Payer: Self-pay | Admitting: Gastroenterology

## 2017-03-12 ENCOUNTER — Ambulatory Visit (AMBULATORY_SURGERY_CENTER): Payer: BC Managed Care – PPO | Admitting: Gastroenterology

## 2017-03-12 VITALS — BP 129/77 | HR 65 | Temp 98.9°F | Resp 20 | Ht 68.0 in | Wt 287.0 lb

## 2017-03-12 DIAGNOSIS — D120 Benign neoplasm of cecum: Secondary | ICD-10-CM

## 2017-03-12 DIAGNOSIS — K649 Unspecified hemorrhoids: Secondary | ICD-10-CM | POA: Diagnosis not present

## 2017-03-12 DIAGNOSIS — K5730 Diverticulosis of large intestine without perforation or abscess without bleeding: Secondary | ICD-10-CM | POA: Diagnosis not present

## 2017-03-12 DIAGNOSIS — K635 Polyp of colon: Secondary | ICD-10-CM

## 2017-03-12 DIAGNOSIS — D126 Benign neoplasm of colon, unspecified: Secondary | ICD-10-CM

## 2017-03-12 DIAGNOSIS — D124 Benign neoplasm of descending colon: Secondary | ICD-10-CM

## 2017-03-12 DIAGNOSIS — Z1211 Encounter for screening for malignant neoplasm of colon: Secondary | ICD-10-CM

## 2017-03-12 MED ORDER — SODIUM CHLORIDE 0.9 % IV SOLN: 500.0000 mL | INTRAVENOUS | Status: DC

## 2017-03-12 NOTE — Patient Instructions (Signed)
YOU HAD AN ENDOSCOPIC PROCEDURE TODAY AT [facility]:   Refer to the procedure report that was given to you for any specific questions about what was found during the examination.  If the procedure report does not answer your questions, please call your gastroenterologist to clarify.  If you requested that your care partner not be given the details of your procedure findings, then the procedure report has been included in a sealed envelope for you to review at your convenience later.  YOU SHOULD EXPECT: Some feelings of bloating in the abdomen. Passage of more gas than usual.  Walking can help get rid of the air that was put into your GI tract during the procedure and reduce the bloating. If you had a lower endoscopy (such as a colonoscopy or flexible sigmoidoscopy) you may notice spotting of blood in your stool or on the toilet paper. If you underwent a bowel prep for your procedure, you may not have a normal bowel movement for a few days.  Please Note:  You might notice some irritation and congestion in your nose or some drainage.  This is from the oxygen used during your procedure.  There is no need for concern and it should clear up in a day or so.  SYMPTOMS TO REPORT IMMEDIATELY:   Following lower endoscopy (colonoscopy or flexible sigmoidoscopy):  Excessive amounts of blood in the stool  Significant tenderness or worsening of abdominal pains  Swelling of the abdomen that is new, acute  Fever of 100F or higher  For urgent or emergent issues, a gastroenterologist can be reached at any hour by calling [phone].  DIET:  We do recommend a small meal at first, but then you may proceed to your regular diet.  Drink plenty of fluids but you should avoid alcoholic beverages for 24 hours.  ACTIVITY:  You should plan to take it easy for the rest of today and you should NOT DRIVE or use heavy machinery until tomorrow (because of the sedation medicines used during the test).     FOLLOW UP: Our staff will call the number listed on your records the next business day following your procedure to check on you and address any questions or concerns that you may have regarding the information given to you following your procedure. If we do not reach you, we will leave a message.  However, if you are feeling well and you are not experiencing any problems, there is no need to return our call.  We will assume that you have returned to your regular daily activities without incident.  If any biopsies were taken you will be contacted by phone or by letter within the next 1-3 weeks.  Please call us at [phone] if you have not heard about the biopsies in 3 weeks.   SIGNATURES/CONFIDENTIALITY: You and/or your care partner have signed paperwork which will be entered into your electronic medical record.  These signatures attest to the fact that that the information above on your After Visit Summary has been reviewed and is understood.  Full responsibility of the confidentiality of this discharge information lies with you and/or your care-partner.  Await pathology  Please read over handouts about polyps, diverticulosis, hemorrhoids and high fiber diets  Continue your normal medications

## 2017-03-12 NOTE — Progress Notes (Signed)
Called to room to assist during endoscopic procedure.  Patient ID and intended procedure confirmed with present staff. Received instructions for my participation in the procedure from the performing physician.  

## 2017-03-12 NOTE — Progress Notes (Signed)
Report to PACU, RN, vss, BBS= Clear.  

## 2017-03-12 NOTE — Op Note (Signed)
[facility] Patient Name: Joseph Choi Procedure Date: 03/12/2017 8:49 AM MRN: [id number] Endoscopist: Milus Banister , MD Age: 56 Referring MD:  Date of Birth: 02-Nov-1960 Gender: Male Account #: [id number] Procedure:                Colonoscopy Indications:              Screening for colorectal malignant neoplasm Medicines:                Monitored Anesthesia Care Procedure:                Pre-Anesthesia Assessment:                           - Prior to the procedure, a History and Physical                            was performed, and patient medications and                            allergies were reviewed. The patient's tolerance of                            previous anesthesia was also reviewed. The risks                            and benefits of the procedure and the sedation                            options and risks were discussed with the patient.                            All questions were answered, and informed consent                            was obtained. Prior Anticoagulants: The patient has                            taken no previous anticoagulant or antiplatelet                            agents. ASA Grade Assessment: II - A patient with                            mild systemic disease. After reviewing the risks                            and benefits, the patient was deemed in                            satisfactory condition to undergo the procedure.                           After obtaining informed consent, the colonoscope  was passed under direct vision. Throughout the                            procedure, the patient's blood pressure, pulse, and                            oxygen saturations were monitored continuously. The                            Colonoscope was introduced through the anus and                            advanced to the the cecum, identified by                            appendiceal orifice and  ileocecal valve. The                            colonoscopy was performed without difficulty. The                            patient tolerated the procedure well. The quality                            of the bowel preparation was good. The ileocecal                            valve, appendiceal orifice, and rectum were                            photographed. Scope In: 8:53:05 AM Scope Out: 9:02:28 AM Scope Withdrawal Time: 0 hours 8 minutes 9 seconds  Total Procedure Duration: 0 hours 9 minutes 23 seconds  Findings:                 Two sessile polyps were found in the descending                            colon and cecum. The polyps were 1 to 4 mm in size.                            These polyps were removed with a cold snare.                            Resection and retrieval were complete.                           Many small-mouthed diverticula were found in the                            left colon.                           External and internal hemorrhoids were found. The  hemorrhoids were small.                           The exam was otherwise without abnormality on                            direct and retroflexion views. Complications:            No immediate complications. Estimated blood loss:                            None. Estimated Blood Loss:     Estimated blood loss: none. Impression:               - Two 1 to 4 mm polyps in the descending colon and                            in the cecum, removed with a cold snare. Resected                            and retrieved.                           - Diverticulosis in the left colon.                           - External and internal hemorrhoids.                           - The examination was otherwise normal on direct                            and retroflexion views. Recommendation:           - Patient has a contact number available for                            emergencies. The signs and  symptoms of potential                            delayed complications were discussed with the                            patient. Return to normal activities tomorrow.                            Written discharge instructions were provided to the                            patient.                           - Resume previous diet.                           - Continue present medications.  You will receive a letter within 2-3 weeks with the                            pathology results and my final recommendations.                           If the polyp(s) is proven to be 'pre-cancerous' on                            pathology, you will need repeat colonoscopy in 5                            years. If the polyp(s) is NOT 'precancerous' on                            pathology then you should repeat colon cancer                            screening in 10 years with colonoscopy without need                            for colon cancer screening by any method prior to                            then (including stool testing). Milus Banister, MD 03/12/2017 9:04:40 AM This report has been signed electronically.

## 2017-03-15 ENCOUNTER — Telehealth: Payer: Self-pay

## 2017-03-15 NOTE — Telephone Encounter (Signed)
  Follow up Call-  Call back number 03/12/2017  Post procedure Call Back phone  # [phone]  Permission to leave phone message Yes  Some recent data might be hidden     Patient questions:  Do you have a fever, pain , or abdominal swelling? No. Pain Score  0 *  Have you tolerated food without any problems? Yes.    Have you been able to return to your normal activities? Yes.    Do you have any questions about your discharge instructions: Diet   No. Medications  No. Follow up visit  No.  Do you have questions or concerns about your Care? No.  Actions: * If pain score is 4 or above: No action needed, pain <4.

## 2017-03-26 ENCOUNTER — Encounter: Payer: Self-pay | Admitting: Gastroenterology

## 2017-12-27 ENCOUNTER — Other Ambulatory Visit: Payer: Self-pay | Admitting: Internal Medicine

## 2017-12-27 ENCOUNTER — Encounter: Payer: Self-pay | Admitting: Internal Medicine

## 2017-12-27 MED ORDER — AMLODIPINE BESY-BENAZEPRIL HCL 5-20 MG PO CAPS: 1.0000 | ORAL_CAPSULE | Freq: Every day | ORAL | 0 refills | Status: DC

## 2017-12-27 NOTE — Telephone Encounter (Signed)
Requested Prescriptions  Pending Prescriptions Disp Refills  . amLODipine-benazepril (LOTREL) 5-20 MG capsule 90 capsule 0    Sig: Take 1 capsule by mouth daily.     Cardiovascular: CCB + ACEI Combos Failed - 12/27/2017  9:57 AM      Failed - Cr in normal range and within 180 days    Creatinine, Ser  Date Value Ref Range Status  12/07/2016 1.16 0.40 - 1.50 mg/dL Final         Failed - K in normal range and within 180 days    Potassium  Date Value Ref Range Status  12/07/2016 4.0 3.5 - 5.1 mEq/L Final         Failed - Valid encounter within last 6 months    Recent Outpatient Visits          1 year ago Preventative health care   [facility] -Georges Mouse, MD   2 years ago Encounter for well adult exam with abnormal findings   [place] John, James W, MD   4 years ago Preventative health care   [place] John, James W, MD   5 years ago Neck pain on right side   [place], James W, MD   5 years ago Preventative health care   [facility] -Georges Mouse, MD      Future Appointments            In 1 month Jenny Reichmann Hunt Oris, MD [place], [place]   In 2 months Biagio Borg, MD [place], [place] - Patient is not pregnant      Passed - Last BP in normal range    BP Readings from Last 1 Encounters:  03/12/17 129/77

## 2017-12-27 NOTE — Telephone Encounter (Signed)
Copied from [place] [phone]. Topic: Quick Communication - Rx Refill/Question >> Dec 27, 2017  9:53 AM Bea Graff, NT wrote: Medication: amLODipine-benazepril (LOTREL) 5-20 MG capsule  Has the patient contacted their pharmacy? Yes.   (Agent: If no, request that the patient contact the pharmacy for the refill.) (Agent: If yes, when and what did the pharmacy advise?)  Preferred Pharmacy (with phone number or street name): [facility] [phone] - [place], [place] - [address] AT [address] [phone] (Phone) [phone] (Fax)  Pt requesting partial refill to last until his 01/28/18 appt.  Agent: Please be advised that RX refills may take up to 3 business days. We ask that you follow-up with your pharmacy.

## 2018-01-11 ENCOUNTER — Telehealth: Payer: Self-pay

## 2018-01-11 NOTE — Telephone Encounter (Signed)
Copied from [place] [id number]. Topic: Appointment Scheduling - Scheduling Inquiry for Clinic >> Jan 11, 2018 10:14 AM Vernona Rieger wrote: Reason for CRM: Patient is calling to see if Dr Jenny Reichmann can put in orders for him to get his labs done a week before his 12/11 physical appointment, please contact patient.

## 2018-01-12 ENCOUNTER — Other Ambulatory Visit: Payer: Self-pay | Admitting: Internal Medicine

## 2018-01-12 DIAGNOSIS — Z0000 Encounter for general adult medical examination without abnormal findings: Secondary | ICD-10-CM

## 2018-01-12 NOTE — Telephone Encounter (Signed)
Ok this is done 

## 2018-01-28 ENCOUNTER — Ambulatory Visit: Payer: BC Managed Care – PPO | Admitting: Internal Medicine

## 2018-01-28 ENCOUNTER — Encounter: Payer: Self-pay | Admitting: Internal Medicine

## 2018-02-06 ENCOUNTER — Other Ambulatory Visit (INDEPENDENT_AMBULATORY_CARE_PROVIDER_SITE_OTHER): Payer: BC Managed Care – PPO

## 2018-02-06 DIAGNOSIS — Z0000 Encounter for general adult medical examination without abnormal findings: Secondary | ICD-10-CM | POA: Diagnosis not present

## 2018-02-06 LAB — URINALYSIS, ROUTINE W REFLEX MICROSCOPIC
Bilirubin Urine: NEGATIVE
Hgb urine dipstick: NEGATIVE
Ketones, ur: NEGATIVE
Leukocytes, UA: NEGATIVE
NITRITE: NEGATIVE
RBC / HPF: NONE SEEN (ref 0–?)
Specific Gravity, Urine: 1.015 (ref 1.000–1.030)
Total Protein, Urine: NEGATIVE
URINE GLUCOSE: NEGATIVE
Urobilinogen, UA: 0.2 (ref 0.0–1.0)
WBC, UA: NONE SEEN (ref 0–?)
pH: 7 (ref 5.0–8.0)

## 2018-02-06 LAB — CBC WITH DIFFERENTIAL/PLATELET
BASOS ABS: 0.1 10*3/uL (ref 0.0–0.1)
Basophils Relative: 0.7 % (ref 0.0–3.0)
EOS ABS: 0.1 10*3/uL (ref 0.0–0.7)
Eosinophils Relative: 1.4 % (ref 0.0–5.0)
HCT: 45.4 % (ref 39.0–52.0)
Hemoglobin: 15.1 g/dL (ref 13.0–17.0)
LYMPHS ABS: 2 10*3/uL (ref 0.7–4.0)
Lymphocytes Relative: 21.8 % (ref 12.0–46.0)
MCHC: 33.4 g/dL (ref 30.0–36.0)
MCV: 89.3 fl (ref 78.0–100.0)
MONO ABS: 0.7 10*3/uL (ref 0.1–1.0)
MONOS PCT: 7.5 % (ref 3.0–12.0)
NEUTROS ABS: 6.2 10*3/uL (ref 1.4–7.7)
NEUTROS PCT: 68.6 % (ref 43.0–77.0)
PLATELETS: 208 10*3/uL (ref 150.0–400.0)
RBC: 5.08 Mil/uL (ref 4.22–5.81)
RDW: 13.5 % (ref 11.5–15.5)
WBC: 9 10*3/uL (ref 4.0–10.5)

## 2018-02-06 LAB — PSA: PSA: 1.52 ng/mL (ref 0.10–4.00)

## 2018-02-06 LAB — BASIC METABOLIC PANEL
BUN: 17 mg/dL (ref 6–23)
CHLORIDE: 105 meq/L (ref 96–112)
CO2: 27 meq/L (ref 19–32)
CREATININE: 1.19 mg/dL (ref 0.40–1.50)
Calcium: 9.6 mg/dL (ref 8.4–10.5)
GFR: 80.32 mL/min (ref >60.00)
Glucose, Bld: 92 mg/dL (ref 70–99)
POTASSIUM: 4.3 meq/L (ref 3.5–5.1)
Sodium: 141 mEq/L (ref 135–145)

## 2018-02-06 LAB — LIPID PANEL
CHOLESTEROL: 147 mg/dL (ref 0–200)
HDL: 37.9 mg/dL — ABNORMAL LOW (ref >39.00)
LDL Cholesterol: 86 mg/dL (ref 0–99)
NonHDL: 109.49
TRIGLYCERIDES: 116 mg/dL (ref 0.0–149.0)
Total CHOL/HDL Ratio: 4
VLDL: 23.2 mg/dL (ref 0.0–40.0)

## 2018-02-06 LAB — HEPATIC FUNCTION PANEL
ALK PHOS: 97 U/L (ref 39–117)
ALT: 21 U/L (ref 0–53)
AST: 25 U/L (ref 0–37)
Albumin: 4.2 g/dL (ref 3.5–5.2)
BILIRUBIN DIRECT: 0.1 mg/dL (ref 0.0–0.3)
BILIRUBIN TOTAL: 0.6 mg/dL (ref 0.2–1.2)
TOTAL PROTEIN: 7.4 g/dL (ref 6.0–8.3)

## 2018-02-06 LAB — TSH: TSH: 2.4 u[IU]/mL (ref 0.35–4.50)

## 2018-02-08 ENCOUNTER — Ambulatory Visit: Payer: BC Managed Care – PPO | Admitting: Internal Medicine

## 2018-02-08 ENCOUNTER — Encounter: Payer: Self-pay | Admitting: Internal Medicine

## 2018-02-08 VITALS — BP 142/88 | HR 72 | Temp 98.7°F | Ht 68.0 in | Wt 293.0 lb

## 2018-02-08 DIAGNOSIS — Z0000 Encounter for general adult medical examination without abnormal findings: Secondary | ICD-10-CM | POA: Diagnosis not present

## 2018-02-08 DIAGNOSIS — E785 Hyperlipidemia, unspecified: Secondary | ICD-10-CM | POA: Diagnosis not present

## 2018-02-08 DIAGNOSIS — I10 Essential (primary) hypertension: Secondary | ICD-10-CM

## 2018-02-08 DIAGNOSIS — N529 Male erectile dysfunction, unspecified: Secondary | ICD-10-CM | POA: Diagnosis not present

## 2018-02-08 DIAGNOSIS — Z23 Encounter for immunization: Secondary | ICD-10-CM

## 2018-02-08 MED ORDER — TADALAFIL 20 MG PO TABS: 20.0000 mg | ORAL_TABLET | Freq: Every day | ORAL | 11 refills | Status: DC | PRN

## 2018-02-08 NOTE — Progress Notes (Signed)
Subjective:    Patient ID: Joseph Choi, male    DOB: January 19, 1961, 57 y.o.   MRN: [id number]  HPI    Here for wellness and f/u;  Overall doing ok;  Pt denies Chest pain, worsening SOB, DOE, wheezing, orthopnea, PND, worsening LE edema, palpitations, dizziness or syncope.  Pt denies neurological change such as new headache, facial or extremity weakness.  Pt denies polydipsia, polyuria, or low sugar symptoms. Pt states overall good compliance with treatment and medications, good tolerability, and has been trying to follow appropriate diet.  Pt denies worsening depressive symptoms, suicidal ideation or panic. No fever, night sweats, wt loss, loss of appetite, or other constitutional symptoms.  Pt states good ability with ADL's, has low fall risk, home safety reviewed and adequate, no other significant changes in hearing or vision, and active with exercise with gym 4 times per week. BP at home < 140/90 per pt and plans to increase his checking.  Has worsening ED symptoms in the past year, asks for cialis prn Past Medical History:  Diagnosis Date  . ANXIETY 01/17/2007  . DEPRESSION 01/17/2007  . GERD 01/17/2007  . HYPERLIPIDEMIA 01/17/2007  . HYPERTENSION 01/17/2007  . Hypertension   . LOW BACK PAIN 01/17/2007  . NECK PAIN, RIGHT 05/09/2010  . OBSTRUCTIVE SLEEP APNEA 01/17/2007  . Sleep apnea   . URI 05/09/2010   Past Surgical History:  Procedure Laterality Date  . FOOT SURGERY    . s/p bilateral knee arthroscopy  2011   Dr. Gladstone Lighter    reports that he has never smoked. He has never used smokeless tobacco. He reports that he does not drink alcohol or use drugs. family history includes Diabetes in his other. No Known Allergies Current Outpatient Medications on File Prior to Visit  Medication Sig Dispense Refill  . amLODipine-benazepril (LOTREL) 5-20 MG capsule Take 1 capsule by mouth daily. 90 capsule 0  . aspirin 81 MG tablet Take 81 mg by mouth daily.      . NON FORMULARY CPAP 13 advanced      No current facility-administered medications on file prior to visit.    Review of Systems Constitutional: Negative for other unusual diaphoresis, sweats, appetite or weight changes HENT: Negative for other worsening hearing loss, ear pain, facial swelling, mouth sores or neck stiffness.   Eyes: Negative for other worsening pain, redness or other visual disturbance.  Respiratory: Negative for other stridor or swelling Cardiovascular: Negative for other palpitations or other chest pain  Gastrointestinal: Negative for worsening diarrhea or loose stools, blood in stool, distention or other pain Genitourinary: Negative for hematuria, flank pain or other change in urine volume.  Musculoskeletal: Negative for myalgias or other joint swelling.  Skin: Negative for other color change, or other wound or worsening drainage.  Neurological: Negative for other syncope or numbness. Hematological: Negative for other adenopathy or swelling Psychiatric/Behavioral: Negative for hallucinations, other worsening agitation, SI, self-injury, or new decreased concentration All other system neg per pt    Objective:   Physical Exam BP (!) 142/88   Pulse 72   Temp 98.7 F (37.1 C) (Oral)   Ht 5\' 8"  (1.727 m)   Wt 293 lb (132.9 kg)   SpO2 96%   BMI 44.55 kg/m  VS noted,  Constitutional: Pt is oriented to person, place, and time. Appears well-developed and well-nourished, in no significant distress and comfortable Head: Normocephalic and atraumatic  Eyes: Conjunctivae and EOM are normal. Pupils are equal, round, and reactive to light Right  Ear: External ear normal without discharge Left Ear: External ear normal without discharge Nose: Nose without discharge or deformity Mouth/Throat: Oropharynx is without other ulcerations and moist  Neck: Normal range of motion. Neck supple. No JVD present. No tracheal deviation present or significant neck LA or mass Cardiovascular: Normal rate, regular rhythm, normal  heart sounds and intact distal pulses.   Pulmonary/Chest: WOB normal and breath sounds without rales or wheezing  Abdominal: Soft. Bowel sounds are normal. NT. No HSM  Musculoskeletal: Normal range of motion. Exhibits no edema Lymphadenopathy: Has no other cervical adenopathy.  Neurological: Pt is alert and oriented to person, place, and time. Pt has normal reflexes. No cranial nerve deficit. Motor grossly intact, Gait intact Skin: Skin is warm and dry. No rash noted or new ulcerations Psychiatric:  Has normal mood and affect. Behavior is normal without agitation No other exam findings Lab Results  Component Value Date   WBC 9.0 02/06/2018   HGB 15.1 02/06/2018   HCT 45.4 02/06/2018   PLT 208.0 02/06/2018   GLUCOSE 92 02/06/2018   CHOL 147 02/06/2018   TRIG 116.0 02/06/2018   HDL 37.90 (L) 02/06/2018   LDLCALC 86 02/06/2018   ALT 21 02/06/2018   AST 25 02/06/2018   NA 141 02/06/2018   K 4.3 02/06/2018   CL 105 02/06/2018   CREATININE 1.19 02/06/2018   BUN 17 02/06/2018   CO2 27 02/06/2018   TSH 2.40 02/06/2018   PSA 1.52 02/06/2018   INR 1.18 07/14/2009       Assessment & Plan:

## 2018-02-08 NOTE — Patient Instructions (Addendum)
You had the flu shot today  Please take all new medication as prescribed - the cialis 20 mg (or you could take half if that works ok)  Please continue all other medications as before, and refills have been done if requested.  Please have the pharmacy call with any other refills you may need.  Please continue your efforts at being more active, low cholesterol diet, and weight control.  You are otherwise up to date with prevention measures today.  Please keep your appointments with your specialists as you may have planned  Please return in 1 year for your yearly visit, or sooner if needed, with Lab testing done 3-5 days before

## 2018-02-09 NOTE — Assessment & Plan Note (Signed)
stable overall by history and exam, recent data reviewed with pt, and pt to continue medical treatment as before,  to f/u any worsening symptoms or concerns  

## 2018-02-09 NOTE — Assessment & Plan Note (Signed)
Ok for cialis prn,  to f/u any worsening symptoms or concerns  

## 2018-02-09 NOTE — Assessment & Plan Note (Signed)

## 2018-02-09 NOTE — Assessment & Plan Note (Signed)
For nutrition counseling,  to f/u any worsening symptoms or concerns

## 2018-02-24 ENCOUNTER — Encounter: Payer: Self-pay | Admitting: Internal Medicine

## 2018-02-25 MED ORDER — SILDENAFIL CITRATE 100 MG PO TABS: 50.0000 mg | ORAL_TABLET | Freq: Every day | ORAL | 11 refills | Status: DC | PRN

## 2018-02-26 ENCOUNTER — Other Ambulatory Visit: Payer: Self-pay | Admitting: Internal Medicine

## 2018-02-26 MED ORDER — TADALAFIL 20 MG PO TABS: 10.0000 mg | ORAL_TABLET | ORAL | 11 refills | Status: DC | PRN

## 2018-02-27 ENCOUNTER — Encounter: Payer: Self-pay | Admitting: Internal Medicine

## 2018-02-27 ENCOUNTER — Ambulatory Visit (INDEPENDENT_AMBULATORY_CARE_PROVIDER_SITE_OTHER): Payer: BC Managed Care – PPO | Admitting: Internal Medicine

## 2018-02-27 VITALS — BP 142/88 | HR 74 | Temp 98.4°F | Ht 68.0 in | Wt 295.0 lb

## 2018-02-27 DIAGNOSIS — F329 Major depressive disorder, single episode, unspecified: Secondary | ICD-10-CM | POA: Diagnosis not present

## 2018-02-27 DIAGNOSIS — M545 Low back pain, unspecified: Secondary | ICD-10-CM

## 2018-02-27 DIAGNOSIS — N529 Male erectile dysfunction, unspecified: Secondary | ICD-10-CM | POA: Diagnosis not present

## 2018-02-27 DIAGNOSIS — F32A Depression, unspecified: Secondary | ICD-10-CM

## 2018-02-27 DIAGNOSIS — I10 Essential (primary) hypertension: Secondary | ICD-10-CM | POA: Diagnosis not present

## 2018-02-27 DIAGNOSIS — R5383 Other fatigue: Secondary | ICD-10-CM

## 2018-02-27 DIAGNOSIS — E785 Hyperlipidemia, unspecified: Secondary | ICD-10-CM

## 2018-02-27 MED ORDER — VARDENAFIL HCL 20 MG PO TABS: 20.0000 mg | ORAL_TABLET | ORAL | 5 refills | Status: DC | PRN

## 2018-02-27 MED ORDER — CYCLOBENZAPRINE HCL 5 MG PO TABS: 5.0000 mg | ORAL_TABLET | Freq: Three times a day (TID) | ORAL | 2 refills | Status: DC | PRN

## 2018-02-27 NOTE — Assessment & Plan Note (Signed)
stable overall by history and exam, recent data reviewed with pt, and pt to continue medical treatment as before,  to f/u any worsening symptoms or concerns  

## 2018-02-27 NOTE — Assessment & Plan Note (Signed)
Ok for levitra prn,  to f/u any worsening symptoms or concerns 

## 2018-02-27 NOTE — Assessment & Plan Note (Signed)
Mild to mod, for muscle relaxer prn,  to f/u any worsening symptoms or concerns 

## 2018-02-27 NOTE — Progress Notes (Signed)
Subjective:    Patient ID: Joseph Choi, male    DOB: Feb 07, 1961, 57 y.o.   MRN: [id number]  HPI  Here to f/u; overall doing ok,  Pt denies chest pain, increasing sob or doe, wheezing, orthopnea, PND, increased LE swelling, palpitations, dizziness or syncope.  Pt denies new neurological symptoms such as new headache, or facial or extremity weakness or numbness.  Pt denies polydipsia, polyuria, or low sugar episode.  Pt states overall good compliance with meds, mostly trying to follow appropriate diet, with wt overall stable,  but little exercise however.  BP at hoem < 140/90 per pt. Stil lgoing to the gym 4 days per wk. Asks for testost level with next labs.  cialis not covered by insurance, asks for change.   BP Readings from Last 3 Encounters:  02/27/18 (!) 142/88  02/08/18 (!) 142/88  03/12/17 129/77  Pt continues to have recurring LBP without change in severity, bowel or bladder change, fever, wt loss,  worsening LE pain/numbness/weakness, gait change or falls. Past Medical History:  Diagnosis Date  . ANXIETY 01/17/2007  . DEPRESSION 01/17/2007  . GERD 01/17/2007  . HYPERLIPIDEMIA 01/17/2007  . HYPERTENSION 01/17/2007  . Hypertension   . LOW BACK PAIN 01/17/2007  . NECK PAIN, RIGHT 05/09/2010  . OBSTRUCTIVE SLEEP APNEA 01/17/2007  . Sleep apnea   . URI 05/09/2010   Past Surgical History:  Procedure Laterality Date  . FOOT SURGERY    . s/p bilateral knee arthroscopy  2011   Dr. Gladstone Lighter    reports that he has never smoked. He has never used smokeless tobacco. He reports that he does not drink alcohol or use drugs. family history includes Diabetes in his other. No Known Allergies Current Outpatient Medications on File Prior to Visit  Medication Sig Dispense Refill  . amLODipine-benazepril (LOTREL) 5-20 MG capsule Take 1 capsule by mouth daily. 90 capsule 0  . aspirin 81 MG tablet Take 81 mg by mouth daily.      . NON FORMULARY CPAP 13 advanced    . sildenafil (VIAGRA) 100 MG  tablet Take 0.5-1 tablets (50-100 mg total) by mouth daily as needed for erectile dysfunction. 5 tablet 11   No current facility-administered medications on file prior to visit.    Review of Systems  Constitutional: Negative for other unusual diaphoresis or sweats HENT: Negative for ear discharge or swelling Eyes: Negative for other worsening visual disturbances Respiratory: Negative for stridor or other swelling  Gastrointestinal: Negative for worsening distension or other blood Genitourinary: Negative for retention or other urinary change Musculoskeletal: Negative for other MSK pain or swelling Skin: Negative for color change or other new lesions Neurological: Negative for worsening tremors and other numbness  Psychiatric/Behavioral: Negative for worsening agitation or other fatigue All other system neg per pt    Objective:   Physical Exam BP (!) 142/88   Pulse 74   Temp 98.4 F (36.9 C) (Oral)   Ht 5\' 8"  (1.727 m)   Wt 295 lb (133.8 kg)   SpO2 97%   BMI 44.85 kg/m  VS noted,  Constitutional: Pt appears in NAD HENT: Head: NCAT.  Right Ear: External ear normal.  Left Ear: External ear normal.  Eyes: . Pupils are equal, round, and reactive to light. Conjunctivae and EOM are normal Nose: without d/c or deformity Neck: Neck supple. Gross normal ROM Cardiovascular: Normal rate and regular rhythm.   Pulmonary/Chest: Effort normal and breath sounds without rales or wheezing.  Abd:  Soft, NT,  ND, + BS, no organomegaly Neurological: Pt is alert. At baseline orientation, motor grossly intact Skin: Skin is warm. No rashes, other new lesions, no LE edema Psychiatric: Pt behavior is normal without agitation  No other exam findings Lab Results  Component Value Date   WBC 9.0 02/06/2018   HGB 15.1 02/06/2018   HCT 45.4 02/06/2018   PLT 208.0 02/06/2018   GLUCOSE 92 02/06/2018   CHOL 147 02/06/2018   TRIG 116.0 02/06/2018   HDL 37.90 (L) 02/06/2018   LDLCALC 86 02/06/2018    ALT 21 02/06/2018   AST 25 02/06/2018   NA 141 02/06/2018   K 4.3 02/06/2018   CL 105 02/06/2018   CREATININE 1.19 02/06/2018   BUN 17 02/06/2018   CO2 27 02/06/2018   TSH 2.40 02/06/2018   PSA 1.52 02/06/2018   INR 1.18 07/14/2009       Assessment & Plan:

## 2018-02-27 NOTE — Assessment & Plan Note (Signed)
Stable with improved diet, but unable to lose wt, ok for nutrition referral

## 2018-02-27 NOTE — Patient Instructions (Signed)
Ok to change the cialis to levitra 20 mg as needed  Please take all new medication as prescribed - the muscle relaxer as needed  Please continue all other medications as before, and refills have been done if requested.  Please have the pharmacy call with any other refills you may need.  Please continue your efforts at being more active, low cholesterol diet, and weight control.  Please keep your appointments with your specialists as you may have planned  Please return in 6 months, or sooner if needed, with Lab testing done 3-5 days before

## 2018-04-03 ENCOUNTER — Other Ambulatory Visit: Payer: Self-pay | Admitting: Internal Medicine

## 2018-09-04 ENCOUNTER — Ambulatory Visit: Payer: BC Managed Care – PPO | Admitting: Internal Medicine

## 2018-09-27 ENCOUNTER — Other Ambulatory Visit: Payer: Self-pay

## 2018-09-27 ENCOUNTER — Encounter: Payer: Self-pay | Admitting: Internal Medicine

## 2018-09-27 ENCOUNTER — Ambulatory Visit (INDEPENDENT_AMBULATORY_CARE_PROVIDER_SITE_OTHER): Payer: BC Managed Care – PPO | Admitting: Internal Medicine

## 2018-09-27 VITALS — BP 134/86 | HR 68 | Temp 98.5°F | Ht 68.0 in | Wt 286.0 lb

## 2018-09-27 DIAGNOSIS — Z23 Encounter for immunization: Secondary | ICD-10-CM

## 2018-09-27 DIAGNOSIS — R739 Hyperglycemia, unspecified: Secondary | ICD-10-CM | POA: Diagnosis not present

## 2018-09-27 DIAGNOSIS — Z0000 Encounter for general adult medical examination without abnormal findings: Secondary | ICD-10-CM

## 2018-09-27 DIAGNOSIS — F329 Major depressive disorder, single episode, unspecified: Secondary | ICD-10-CM | POA: Diagnosis not present

## 2018-09-27 DIAGNOSIS — E538 Deficiency of other specified B group vitamins: Secondary | ICD-10-CM

## 2018-09-27 DIAGNOSIS — R350 Frequency of micturition: Secondary | ICD-10-CM

## 2018-09-27 DIAGNOSIS — F32A Depression, unspecified: Secondary | ICD-10-CM

## 2018-09-27 DIAGNOSIS — E785 Hyperlipidemia, unspecified: Secondary | ICD-10-CM

## 2018-09-27 DIAGNOSIS — I10 Essential (primary) hypertension: Secondary | ICD-10-CM

## 2018-09-27 DIAGNOSIS — E611 Iron deficiency: Secondary | ICD-10-CM

## 2018-09-27 DIAGNOSIS — E559 Vitamin D deficiency, unspecified: Secondary | ICD-10-CM

## 2018-09-27 MED ORDER — TOLTERODINE TARTRATE ER 4 MG PO CP24: 4.0000 mg | ORAL_CAPSULE | Freq: Every day | ORAL | 3 refills | Status: DC

## 2018-09-27 MED ORDER — AMLODIPINE BESY-BENAZEPRIL HCL 5-20 MG PO CAPS: 1.0000 | ORAL_CAPSULE | Freq: Every day | ORAL | 1 refills | Status: DC

## 2018-09-27 NOTE — Progress Notes (Signed)
Subjective:    Patient ID: Joseph Choi, male    DOB: June 26, 1960, 58 y.o.   MRN: [id number]  HPI  Here to f/u; overall doing ok,  Pt denies chest pain, increasing sob or doe, wheezing, orthopnea, PND, increased LE swelling, palpitations, dizziness or syncope.  Pt denies new neurological symptoms such as new headache, or facial or extremity weakness or numbness.  Pt denies polydipsia, polyuria, or low sugar episode.  Pt states overall good compliance with meds, mostly trying to follow appropriate diet, with wt overall stable,  but little exercise however.  Lost wt about 10 lbs since last visit with better diet. Wt Readings from Last 3 Encounters:  09/27/18 286 lb (129.7 kg)  02/27/18 295 lb (133.8 kg)  02/08/18 293 lb (132.9 kg)  Walks at least > 7 miles per day at work.    Denies worsening depressive symptoms, suicidal ideation, or panic; has ongoing anxiety, not increased recently. Good compliance with nightly CPAP.  Denies urinary symptoms such as dysuria, urgency, flank pain, hematuria or n/v, fever, chills, but has recent worsening persistent urinary frequency for over 1 mo, has to urinate about every 90 min, and denies weaker stream or retention  Denies worsening depressive symptoms, suicidal ideation, or panic Past Medical History:  Diagnosis Date  . ANXIETY 01/17/2007  . DEPRESSION 01/17/2007  . GERD 01/17/2007  . HYPERLIPIDEMIA 01/17/2007  . HYPERTENSION 01/17/2007  . Hypertension   . LOW BACK PAIN 01/17/2007  . NECK PAIN, RIGHT 05/09/2010  . OBSTRUCTIVE SLEEP APNEA 01/17/2007  . Sleep apnea   . URI 05/09/2010   Past Surgical History:  Procedure Laterality Date  . FOOT SURGERY    . s/p bilateral knee arthroscopy  2011   Dr. Gladstone Lighter    reports that he has never smoked. He has never used smokeless tobacco. He reports that he does not drink alcohol or use drugs. family history includes Diabetes in an other family member. No Known Allergies Current Outpatient Medications on  File Prior to Visit  Medication Sig Dispense Refill  . aspirin 81 MG tablet Take 81 mg by mouth daily.      . cyclobenzaprine (FLEXERIL) 5 MG tablet Take 1 tablet (5 mg total) by mouth 3 (three) times daily as needed for muscle spasms. 40 tablet 2  . NON FORMULARY CPAP 13 advanced    . sildenafil (VIAGRA) 100 MG tablet Take 0.5-1 tablets (50-100 mg total) by mouth daily as needed for erectile dysfunction. 5 tablet 11  . vardenafil (LEVITRA) 20 MG tablet Take 1 tablet (20 mg total) by mouth as needed for erectile dysfunction. 10 tablet 5   No current facility-administered medications on file prior to visit.    Review of Systems  Constitutional: Negative for other unusual diaphoresis or sweats HENT: Negative for ear discharge or swelling Eyes: Negative for other worsening visual disturbances Respiratory: Negative for stridor or other swelling  Gastrointestinal: Negative for worsening distension or other blood Genitourinary: Negative for retention or other urinary change Musculoskeletal: Negative for other MSK pain or swelling Skin: Negative for color change or other new lesions Neurological: Negative for worsening tremors and other numbness  Psychiatric/Behavioral: Negative for worsening agitation or other fatigue All other system neg per pt    Objective:   Physical Exam BP 134/86   Pulse 68   Temp 98.5 F (36.9 C) (Oral)   Ht 5\' 8"  (1.727 m)   Wt 286 lb (129.7 kg)   SpO2 97%   BMI 43.49 kg/m  VS noted,  Constitutional: Pt appears in NAD HENT: Head: NCAT.  Right Ear: External ear normal.  Left Ear: External ear normal.  Eyes: . Pupils are equal, round, and reactive to light. Conjunctivae and EOM are normal Nose: without d/c or deformity Neck: Neck supple. Gross normal ROM Cardiovascular: Normal rate and regular rhythm.   Pulmonary/Chest: Effort normal and breath sounds without rales or wheezing.  Abd:  Soft, NT, ND, + BS, no organomegaly Neurological: Pt is alert. At  baseline orientation, motor grossly intact Skin: Skin is warm. No rashes, other new lesions, no LE edema Psychiatric: Pt behavior is normal without agitation  No other exam findings Lab Results  Component Value Date   WBC 9.0 02/06/2018   HGB 15.1 02/06/2018   HCT 45.4 02/06/2018   PLT 208.0 02/06/2018   GLUCOSE 92 02/06/2018   CHOL 147 02/06/2018   TRIG 116.0 02/06/2018   HDL 37.90 (L) 02/06/2018   LDLCALC 86 02/06/2018   ALT 21 02/06/2018   AST 25 02/06/2018   NA 141 02/06/2018   K 4.3 02/06/2018   CL 105 02/06/2018   CREATININE 1.19 02/06/2018   BUN 17 02/06/2018   CO2 27 02/06/2018   TSH 2.40 02/06/2018   PSA 1.52 02/06/2018   INR 1.18 07/14/2009       Assessment & Plan:

## 2018-09-27 NOTE — Patient Instructions (Addendum)
You had Tdap tetanus shot today  Please take all new medication as prescribed - the detrol LA  /Please continue all other medications as before, and refills have been done if requested.  Please have the pharmacy call with any other refills you may need.  Please continue your efforts at being more active, low cholesterol diet, and weight control.  You are otherwise up to date with prevention measures today.  Please keep your appointments with your specialists as you may have planned  Please remember to sign up for MyChart if you have not done so, as this will be important to you in the future with finding out test results, communicating by private email, and scheduling acute appointments online when needed.  Please return in 6 months, or sooner if needed, with Lab testing done 3-5 days before

## 2018-09-28 ENCOUNTER — Encounter: Payer: Self-pay | Admitting: Internal Medicine

## 2018-09-28 DIAGNOSIS — R350 Frequency of micturition: Secondary | ICD-10-CM | POA: Insufficient documentation

## 2018-09-28 NOTE — Assessment & Plan Note (Signed)
C/w probable OAB, for trial Detrol LA 4 qd,  to f/u any worsening symptoms or concerns

## 2018-09-28 NOTE — Assessment & Plan Note (Signed)
stable overall by history and exam, recent data reviewed with pt, and pt to continue medical treatment as before,  to f/u any worsening symptoms or concerns  

## 2018-11-12 ENCOUNTER — Telehealth: Payer: Self-pay

## 2018-11-12 NOTE — Telephone Encounter (Signed)
Form was faxed back earlier today  Copied from [place] [phone]. Topic: General - Inquiry >> Nov 12, 2018 10:06 AM Virl Axe D wrote: Reason for CRM: [facility] called to follow up on request for CPAP supplies faxed over on 10/31/18. They are re faxing request today and need Dr. Jenny Reichmann to fill out form and return. Please advise.

## 2019-03-25 ENCOUNTER — Other Ambulatory Visit: Payer: Self-pay

## 2019-03-25 ENCOUNTER — Ambulatory Visit: Payer: BC Managed Care – PPO | Admitting: Internal Medicine

## 2019-03-25 ENCOUNTER — Encounter: Payer: Self-pay | Admitting: Internal Medicine

## 2019-03-25 ENCOUNTER — Other Ambulatory Visit: Payer: Self-pay | Admitting: Internal Medicine

## 2019-03-25 VITALS — BP 138/86 | HR 86 | Temp 98.2°F | Resp 12 | Ht 68.0 in | Wt 293.0 lb

## 2019-03-25 DIAGNOSIS — R221 Localized swelling, mass and lump, neck: Secondary | ICD-10-CM

## 2019-03-25 DIAGNOSIS — Z0000 Encounter for general adult medical examination without abnormal findings: Secondary | ICD-10-CM | POA: Diagnosis not present

## 2019-03-25 DIAGNOSIS — E538 Deficiency of other specified B group vitamins: Secondary | ICD-10-CM | POA: Diagnosis not present

## 2019-03-25 DIAGNOSIS — E611 Iron deficiency: Secondary | ICD-10-CM | POA: Diagnosis not present

## 2019-03-25 DIAGNOSIS — E559 Vitamin D deficiency, unspecified: Secondary | ICD-10-CM

## 2019-03-25 DIAGNOSIS — I10 Essential (primary) hypertension: Secondary | ICD-10-CM

## 2019-03-25 DIAGNOSIS — Z125 Encounter for screening for malignant neoplasm of prostate: Secondary | ICD-10-CM

## 2019-03-25 LAB — URINALYSIS, ROUTINE W REFLEX MICROSCOPIC
Bilirubin Urine: NEGATIVE
Hgb urine dipstick: NEGATIVE
Ketones, ur: NEGATIVE
Leukocytes,Ua: NEGATIVE
Nitrite: NEGATIVE
RBC / HPF: NONE SEEN (ref 0–?)
Specific Gravity, Urine: 1.02 (ref 1.000–1.030)
Total Protein, Urine: NEGATIVE
Urine Glucose: NEGATIVE
Urobilinogen, UA: 0.2 (ref 0.0–1.0)
WBC, UA: NONE SEEN (ref 0–?)
pH: 7 (ref 5.0–8.0)

## 2019-03-25 LAB — BASIC METABOLIC PANEL
BUN: 12 mg/dL (ref 6–23)
CO2: 28 mEq/L (ref 19–32)
Calcium: 9.4 mg/dL (ref 8.4–10.5)
Chloride: 107 mEq/L (ref 96–112)
Creatinine, Ser: 1.18 mg/dL (ref 0.40–1.50)
GFR: 76.02 mL/min (ref >60.00)
Glucose, Bld: 100 mg/dL — ABNORMAL HIGH (ref 70–99)
Potassium: 4.3 mEq/L (ref 3.5–5.1)
Sodium: 142 mEq/L (ref 135–145)

## 2019-03-25 LAB — CBC WITH DIFFERENTIAL/PLATELET
Basophils Absolute: 0 10*3/uL (ref 0.0–0.1)
Basophils Relative: 0.5 % (ref 0.0–3.0)
Eosinophils Absolute: 0.1 10*3/uL (ref 0.0–0.7)
Eosinophils Relative: 1.1 % (ref 0.0–5.0)
HCT: 45.2 % (ref 39.0–52.0)
Hemoglobin: 14.9 g/dL (ref 13.0–17.0)
Lymphocytes Relative: 17.9 % (ref 12.0–46.0)
Lymphs Abs: 1.5 10*3/uL (ref 0.7–4.0)
MCHC: 33 g/dL (ref 30.0–36.0)
MCV: 90 fl (ref 78.0–100.0)
Monocytes Absolute: 0.5 10*3/uL (ref 0.1–1.0)
Monocytes Relative: 6.2 % (ref 3.0–12.0)
Neutro Abs: 6.4 10*3/uL (ref 1.4–7.7)
Neutrophils Relative %: 74.3 % (ref 43.0–77.0)
Platelets: 201 10*3/uL (ref 150.0–400.0)
RBC: 5.03 Mil/uL (ref 4.22–5.81)
RDW: 13.7 % (ref 11.5–15.5)
WBC: 8.6 10*3/uL (ref 4.0–10.5)

## 2019-03-25 LAB — LIPID PANEL
Cholesterol: 157 mg/dL (ref 0–200)
HDL: 38.9 mg/dL — ABNORMAL LOW (ref >39.00)
LDL Cholesterol: 94 mg/dL (ref 0–99)
NonHDL: 118.19
Total CHOL/HDL Ratio: 4
Triglycerides: 120 mg/dL (ref 0.0–149.0)
VLDL: 24 mg/dL (ref 0.0–40.0)

## 2019-03-25 LAB — HEPATIC FUNCTION PANEL
ALT: 26 U/L (ref 0–53)
AST: 27 U/L (ref 0–37)
Albumin: 4.1 g/dL (ref 3.5–5.2)
Alkaline Phosphatase: 102 U/L (ref 39–117)
Bilirubin, Direct: 0.1 mg/dL (ref 0.0–0.3)
Total Bilirubin: 0.7 mg/dL (ref 0.2–1.2)
Total Protein: 7.3 g/dL (ref 6.0–8.3)

## 2019-03-25 LAB — TSH: TSH: 2.72 u[IU]/mL (ref 0.35–4.50)

## 2019-03-25 LAB — PSA: PSA: 1.4 ng/mL (ref 0.10–4.00)

## 2019-03-25 LAB — VITAMIN D 25 HYDROXY (VIT D DEFICIENCY, FRACTURES): VITD: 24.98 ng/mL — ABNORMAL LOW (ref 30.00–100.00)

## 2019-03-25 LAB — IBC PANEL
Iron: 112 ug/dL (ref 42–165)
Saturation Ratios: 36 % (ref 20.0–50.0)
Transferrin: 222 mg/dL (ref 212.0–360.0)

## 2019-03-25 LAB — VITAMIN B12: Vitamin B-12: 500 pg/mL (ref 211–911)

## 2019-03-25 MED ORDER — VITAMIN D (ERGOCALCIFEROL) 1.25 MG (50000 UNIT) PO CAPS: 50000.0000 [IU] | ORAL_CAPSULE | ORAL | 0 refills | Status: DC

## 2019-03-25 NOTE — Progress Notes (Signed)
Subjective:    Patient ID: Joseph Choi, male    DOB: Sep 24, 1960, 59 y.o.   MRN: [id number]  HPI  Here for wellness and f/u;  Overall doing ok;  Pt denies Chest pain, worsening SOB, DOE, wheezing, orthopnea, PND, worsening LE edema, palpitations, dizziness or syncope.  Pt denies neurological change such as new headache, facial or extremity weakness.  Pt denies polydipsia, polyuria, or low sugar symptoms. Pt states overall good compliance with treatment and medications, good tolerability, and has been trying to follow appropriate diet.  Pt denies worsening depressive symptoms, suicidal ideation or panic. No fever, night sweats, wt loss, loss of appetite, or other constitutional symptoms.  Pt states good ability with ADL's, has low fall risk, home safety reviewed and adequate, no other significant changes in hearing or vision, and only occasionally active with exercise.  Not aware of any neck lump Past Medical History:  Diagnosis Date  . ANXIETY 01/17/2007  . DEPRESSION 01/17/2007  . GERD 01/17/2007  . HYPERLIPIDEMIA 01/17/2007  . HYPERTENSION 01/17/2007  . Hypertension   . LOW BACK PAIN 01/17/2007  . NECK PAIN, RIGHT 05/09/2010  . OBSTRUCTIVE SLEEP APNEA 01/17/2007  . Sleep apnea   . URI 05/09/2010   Past Surgical History:  Procedure Laterality Date  . FOOT SURGERY    . s/p bilateral knee arthroscopy  2011   Dr. Gladstone Lighter    reports that he has never smoked. He has never used smokeless tobacco. He reports that he does not drink alcohol or use drugs. family history includes Diabetes in an other family member. No Known Allergies Current Outpatient Medications on File Prior to Visit  Medication Sig Dispense Refill  . aspirin 81 MG tablet Take 81 mg by mouth daily.      . cyclobenzaprine (FLEXERIL) 5 MG tablet Take 1 tablet (5 mg total) by mouth 3 (three) times daily as needed for muscle spasms. 40 tablet 2  . NON FORMULARY CPAP 13 advanced    . sildenafil (VIAGRA) 100 MG tablet Take  0.5-1 tablets (50-100 mg total) by mouth daily as needed for erectile dysfunction. 5 tablet 11  . tolterodine (DETROL LA) 4 MG 24 hr capsule Take 1 capsule (4 mg total) by mouth daily. 90 capsule 3  . vardenafil (LEVITRA) 20 MG tablet Take 1 tablet (20 mg total) by mouth as needed for erectile dysfunction. 10 tablet 5   No current facility-administered medications on file prior to visit.   Review of Systems Constitutional: Negative for other unusual diaphoresis, sweats, appetite or weight changes HENT: Negative for other worsening hearing loss, ear pain, facial swelling, mouth sores or neck stiffness.   Eyes: Negative for other worsening pain, redness or other visual disturbance.  Respiratory: Negative for other stridor or swelling Cardiovascular: Negative for other palpitations or other chest pain  Gastrointestinal: Negative for worsening diarrhea or loose stools, blood in stool, distention or other pain Genitourinary: Negative for hematuria, flank pain or other change in urine volume.  Musculoskeletal: Negative for myalgias or other joint swelling.  Skin: Negative for other color change, or other wound or worsening drainage.  Neurological: Negative for other syncope or numbness. Hematological: Negative for other adenopathy or swelling Psychiatric/Behavioral: Negative for hallucinations, other worsening agitation, SI, self-injury, or new decreased concentration All otherwise neg per pt     Objective:   Physical Exam BP 138/86   Pulse 86   Temp 98.2 F (36.8 C)   Resp 12   Ht 5\' 8"  (1.727 m)  Wt 293 lb (132.9 kg)   SpO2 97%   BMI 44.55 kg/m  VS noted,  Constitutional: Pt is oriented to person, place, and time. Appears well-developed and well-nourished, in no significant distress and comfortable Head: Normocephalic and atraumatic  Eyes: Conjunctivae and EOM are normal. Pupils are equal, round, and reactive to light Right Ear: External ear normal without discharge Left Ear:  External ear normal without discharge Nose: Nose without discharge or deformity Mouth/Throat: Oropharynx is without other ulcerations and moist  Neck: Normal range of motion. Neck supple. No JVD present. No tracheal deviation present , but has 1 cm left mid neck suprathyroid firm mass, mobile nontender without overlying skin change or other mass or LA Cardiovascular: Normal rate, regular rhythm, normal heart sounds and intact distal pulses.   Pulmonary/Chest: WOB normal and breath sounds without rales or wheezing  Abdominal: Soft. Bowel sounds are normal. NT. No HSM  Musculoskeletal: Normal range of motion. Exhibits no edema Lymphadenopathy: Has no other cervical adenopathy.  Neurological: Pt is alert and oriented to person, place, and time. Pt has normal reflexes. No cranial nerve deficit. Motor grossly intact, Gait intact Skin: Skin is warm and dry. No rash noted or new ulcerations Psychiatric:  Has normal mood and affect. Behavior is normal without agitation All otherwise neg per pt Lab Results  Component Value Date   WBC 8.6 03/25/2019   HGB 14.9 03/25/2019   HCT 45.2 03/25/2019   PLT 201.0 03/25/2019   GLUCOSE 100 (H) 03/25/2019   CHOL 157 03/25/2019   TRIG 120.0 03/25/2019   HDL 38.90 (L) 03/25/2019   LDLCALC 94 03/25/2019   ALT 26 03/25/2019   AST 27 03/25/2019   NA 142 03/25/2019   K 4.3 03/25/2019   CL 107 03/25/2019   CREATININE 1.18 03/25/2019   BUN 12 03/25/2019   CO2 28 03/25/2019   TSH 2.72 03/25/2019   PSA 1.40 03/25/2019   INR 1.18 07/14/2009      Assessment & Plan:

## 2019-03-25 NOTE — Patient Instructions (Signed)
Please continue all other medications as before, and refills have been done if requested.  Please have the pharmacy call with any other refills you may need.  Please continue your efforts at being more active, low cholesterol diet, and weight control.  You are otherwise up to date with prevention measures today.  Please keep your appointments with your specialists as you may have planned  You will be contacted regarding the referral for: neck ultrasound  Please go to the LAB at the blood drawing area for the tests to be done  You will be contacted by phone if any changes need to be made immediately.  Otherwise, you will receive a letter about your results with an explanation, but please check with MyChart first.  Please remember to sign up for MyChart if you have not done so, as this will be important to you in the future with finding out test results, communicating by private email, and scheduling acute appointments online when needed.  Please return in 1 year for your yearly visit, or sooner if needed, with Lab testing done 3-5 days before at the [facility] - please make appt as you leave today for this

## 2019-03-25 NOTE — Assessment & Plan Note (Signed)
Pt declines CT, for neck u/s r/o mass, consider ENT

## 2019-03-27 ENCOUNTER — Other Ambulatory Visit: Payer: Self-pay | Admitting: Internal Medicine

## 2019-03-27 NOTE — Telephone Encounter (Signed)
Please refill as per office routine med refill policy (all routine meds refilled for 3 mo or monthly per pt preference up to one year from last visit, then month to month grace period for 3 mo, then further med refills will have to be denied)  

## 2019-03-30 ENCOUNTER — Encounter: Payer: Self-pay | Admitting: Internal Medicine

## 2019-03-30 NOTE — Assessment & Plan Note (Signed)
stable overall by history and exam, recent data reviewed with pt, and pt to continue medical treatment as before,  to f/u any worsening symptoms or concerns  

## 2019-03-30 NOTE — Assessment & Plan Note (Signed)

## 2019-04-03 ENCOUNTER — Encounter: Payer: Self-pay | Admitting: Internal Medicine

## 2019-09-10 ENCOUNTER — Other Ambulatory Visit: Payer: Self-pay

## 2019-09-10 ENCOUNTER — Ambulatory Visit (INDEPENDENT_AMBULATORY_CARE_PROVIDER_SITE_OTHER): Payer: BC Managed Care – PPO | Admitting: Internal Medicine

## 2019-09-10 VITALS — BP 150/92 | HR 72 | Temp 98.9°F | Ht 68.0 in | Wt 299.0 lb

## 2019-09-10 DIAGNOSIS — E559 Vitamin D deficiency, unspecified: Secondary | ICD-10-CM | POA: Diagnosis not present

## 2019-09-10 DIAGNOSIS — M79661 Pain in right lower leg: Secondary | ICD-10-CM

## 2019-09-10 DIAGNOSIS — E538 Deficiency of other specified B group vitamins: Secondary | ICD-10-CM | POA: Diagnosis not present

## 2019-09-10 DIAGNOSIS — M7989 Other specified soft tissue disorders: Secondary | ICD-10-CM

## 2019-09-10 DIAGNOSIS — Z0000 Encounter for general adult medical examination without abnormal findings: Secondary | ICD-10-CM

## 2019-09-10 DIAGNOSIS — E785 Hyperlipidemia, unspecified: Secondary | ICD-10-CM

## 2019-09-10 DIAGNOSIS — I824Z1 Acute embolism and thrombosis of unspecified deep veins of right distal lower extremity: Secondary | ICD-10-CM | POA: Insufficient documentation

## 2019-09-10 DIAGNOSIS — I10 Essential (primary) hypertension: Secondary | ICD-10-CM

## 2019-09-10 NOTE — Assessment & Plan Note (Addendum)
Seems high risk for DVT, no cardiopulm symptoms, Declines venous doppler tonight or ED visit but will need asap  I spent 41 minutes in preparing to see the patient by review of recent labs, imaging and procedures, obtaining and reviewing separately obtained history, communicating with the patient and family or caregiver, ordering medications, tests or procedures, and documenting clinical information in the EHR including the differential Dx, treatment, and any further evaluation and other management of right leg pain and swelling, vit d def, HTN

## 2019-09-10 NOTE — Progress Notes (Signed)
Subjective:    Patient ID: Joseph Choi, male    DOB: 01-30-1961, 59 y.o.   MRN: [id number]  HPI  Here with c/o new onset right leg pain and swelling for several days after a long drive from [place] to home a few days ago.  Has also been doing quite a bit of standing and bending with moving larger things so wondering if could be some kind of muscle strain.  Has gained wt with less active recently with feet pain Wt Readings from Last 3 Encounters:  09/10/19 299 lb (135.6 kg)  03/25/19 293 lb (132.9 kg)  09/27/18 286 lb (129.7 kg)   BP Readings from Last 3 Encounters:  09/10/19 (!) 150/92  03/25/19 138/86  09/27/18 134/86  Pt denies chest pain, increased sob or doe, wheezing, orthopnea, PND, increased LE swelling, palpitations, dizziness or syncope.  Pt denies new neurological symptoms such as new headache, or facial or extremity weakness or numbness   Pt denies polydipsia, polyuria Past Medical History:  Diagnosis Date  . ANXIETY 01/17/2007  . DEPRESSION 01/17/2007  . GERD 01/17/2007  . HYPERLIPIDEMIA 01/17/2007  . HYPERTENSION 01/17/2007  . Hypertension   . LOW BACK PAIN 01/17/2007  . NECK PAIN, RIGHT 05/09/2010  . OBSTRUCTIVE SLEEP APNEA 01/17/2007  . Sleep apnea   . URI 05/09/2010   Past Surgical History:  Procedure Laterality Date  . FOOT SURGERY    . s/p bilateral knee arthroscopy  2011   Dr. Gladstone Lighter    reports that he has never smoked. He has never used smokeless tobacco. He reports that he does not drink alcohol and does not use drugs. family history includes Diabetes in an other family member. No Known Allergies Current Outpatient Medications on File Prior to Visit  Medication Sig Dispense Refill  . amLODipine-benazepril (LOTREL) 5-20 MG capsule TAKE 1 CAPSULE BY MOUTH DAILY 90 capsule 3  . aspirin 81 MG tablet Take 81 mg by mouth daily.      . cyclobenzaprine (FLEXERIL) 5 MG tablet Take 1 tablet (5 mg total) by mouth 3 (three) times daily as needed for muscle  spasms. 40 tablet 2  . NON FORMULARY CPAP 13 advanced    . sildenafil (VIAGRA) 100 MG tablet Take 0.5-1 tablets (50-100 mg total) by mouth daily as needed for erectile dysfunction. 5 tablet 11  . tolterodine (DETROL LA) 4 MG 24 hr capsule Take 1 capsule (4 mg total) by mouth daily. 90 capsule 3  . Vitamin D, Ergocalciferol, (DRISDOL) 1.25 MG (50000 UT) CAPS capsule Take 1 capsule (50,000 Units total) by mouth every 7 (seven) days. 12 capsule 0  . vardenafil (LEVITRA) 20 MG tablet Take 1 tablet (20 mg total) by mouth as needed for erectile dysfunction. 10 tablet 5   No current facility-administered medications on file prior to visit.   Review of Systems All otherwise neg per pt     Objective:   Physical Exam BP (!) 150/92 (BP Location: Left Arm, Patient Position: Sitting, Cuff Size: Large)   Pulse 72   Temp 98.9 F (37.2 C) (Oral)   Ht 5\' 8"  (1.727 m)   Wt 299 lb (135.6 kg)   SpO2 97%   BMI 45.46 kg/m  VS noted,  Constitutional: Pt appears in NAD HENT: Head: NCAT.  Right Ear: External ear normal.  Left Ear: External ear normal.  Eyes: . Pupils are equal, round, and reactive to light. Conjunctivae and EOM are normal Nose: without d/c or deformity Neck: Neck  supple. Gross normal ROM Cardiovascular: Normal rate and regular rhythm.   Pulmonary/Chest: Effort normal and breath sounds without rales or wheezing.  Abd:  Soft, NT, ND, + BS, no organomegaly Neurological: Pt is alert. At baseline orientation, motor grossly intact Skin: Skin is warm. No rashes, other new lesions, RLE with 2+ generalized edema to just above the knee with tender calf. + homans sign Psychiatric: Pt behavior is normal without agitation  All otherwise neg per pt Lab Results  Component Value Date   WBC 8.6 03/25/2019   HGB 14.9 03/25/2019   HCT 45.2 03/25/2019   PLT 201.0 03/25/2019   GLUCOSE 100 (H) 03/25/2019   CHOL 157 03/25/2019   TRIG 120.0 03/25/2019   HDL 38.90 (L) 03/25/2019   LDLCALC 94  03/25/2019   ALT 26 03/25/2019   AST 27 03/25/2019   NA 142 03/25/2019   K 4.3 03/25/2019   CL 107 03/25/2019   CREATININE 1.18 03/25/2019   BUN 12 03/25/2019   CO2 28 03/25/2019   TSH 2.72 03/25/2019   PSA 1.40 03/25/2019   INR 1.18 07/14/2009          Assessment & Plan:

## 2019-09-10 NOTE — Patient Instructions (Signed)
You will be contacted regarding the referral for: the right leg vein test to rule out blood clot (asap tomorrow)  Please continue all other medications as before, and refills have been done if requested.  Please have the pharmacy call with any other refills you may need.  Please continue your efforts at being more active, low cholesterol diet, and weight control.  Please keep your appointments with your specialists as you may have planned  Please make an Appointment to return in 6 months, or sooner if needed, also with Lab Appointment for testing done 3-5 days before at the [facility] (so this is for TWO appointments - please see the scheduling desk as you leave)

## 2019-09-12 ENCOUNTER — Ambulatory Visit (HOSPITAL_COMMUNITY)
Admission: RE | Admit: 2019-09-12 | Discharge: 2019-09-12 | Disposition: A | Discharge status: Home / Self Care | Payer: BC Managed Care – PPO | Source: Ambulatory Visit | Attending: Internal Medicine | Admitting: Internal Medicine

## 2019-09-12 ENCOUNTER — Telehealth: Payer: Self-pay

## 2019-09-12 ENCOUNTER — Other Ambulatory Visit: Payer: Self-pay

## 2019-09-12 ENCOUNTER — Telehealth: Payer: Self-pay | Admitting: Internal Medicine

## 2019-09-12 DIAGNOSIS — M79661 Pain in right lower leg: Secondary | ICD-10-CM | POA: Diagnosis present

## 2019-09-12 DIAGNOSIS — M7989 Other specified soft tissue disorders: Secondary | ICD-10-CM | POA: Insufficient documentation

## 2019-09-12 MED ORDER — APIXABAN 5 MG PO TABS: 5.0000 mg | ORAL_TABLET | Freq: Two times a day (BID) | ORAL | 5 refills | Status: DC

## 2019-09-12 NOTE — Telephone Encounter (Signed)
Notified pt + DVT RLE -   To start eliquis 5 bid asap  Please ask pt to f/u in office in 1 wk, or sooner if needed

## 2019-09-12 NOTE — Telephone Encounter (Signed)
Vein and vascular has been informed.

## 2019-09-12 NOTE — Progress Notes (Signed)
Lower extremity venous has been completed.   Preliminary results in CV Proc.   Abram Sander 09/12/2019 1:06 PM

## 2019-09-12 NOTE — Telephone Encounter (Signed)
-----   Message from Biagio Borg, MD sent at 09/12/2019  1:08 PM EDT ----- See note done - ok for eliquis 5 bid, and f/u OV 1 wk ----- Message ----- From: Juliet Rude, CMA Sent: 09/12/2019   1:04 PM EDT To: Biagio Borg, MD  Positive for DVT gastroc veins

## 2019-09-17 ENCOUNTER — Encounter: Payer: Self-pay | Admitting: Internal Medicine

## 2019-09-17 NOTE — Assessment & Plan Note (Signed)
stable overall by history and exam, recent data reviewed with pt, and pt to continue medical treatment as before,  to f/u any worsening symptoms or concerns  

## 2019-09-17 NOTE — Assessment & Plan Note (Signed)
For oral replacement 

## 2019-09-23 ENCOUNTER — Other Ambulatory Visit: Payer: Self-pay

## 2019-09-23 ENCOUNTER — Ambulatory Visit (INDEPENDENT_AMBULATORY_CARE_PROVIDER_SITE_OTHER): Payer: BC Managed Care – PPO | Admitting: Internal Medicine

## 2019-09-23 ENCOUNTER — Encounter: Payer: Self-pay | Admitting: Internal Medicine

## 2019-09-23 VITALS — BP 148/96 | HR 65 | Temp 98.0°F | Ht 68.0 in | Wt 299.0 lb

## 2019-09-23 DIAGNOSIS — I10 Essential (primary) hypertension: Secondary | ICD-10-CM | POA: Diagnosis not present

## 2019-09-23 DIAGNOSIS — E785 Hyperlipidemia, unspecified: Secondary | ICD-10-CM | POA: Diagnosis not present

## 2019-09-23 DIAGNOSIS — I824Z1 Acute embolism and thrombosis of unspecified deep veins of right distal lower extremity: Secondary | ICD-10-CM | POA: Diagnosis not present

## 2019-09-23 DIAGNOSIS — G4733 Obstructive sleep apnea (adult) (pediatric): Secondary | ICD-10-CM

## 2019-09-23 MED ORDER — AMLODIPINE BESY-BENAZEPRIL HCL 5-40 MG PO CAPS: 1.0000 | ORAL_CAPSULE | Freq: Every day | ORAL | 3 refills | Status: DC

## 2019-09-23 NOTE — Assessment & Plan Note (Signed)
stable overall by history and exam, recent data reviewed with pt, and pt to continue medical treatment as before,  to f/u any worsening symptoms or concerns  

## 2019-09-23 NOTE — Addendum Note (Signed)
Addended by: Biagio Borg on: 09/23/2019 08:44 AM   Modules accepted: Level of Service

## 2019-09-23 NOTE — Progress Notes (Signed)
Subjective:    Patient ID: Joseph Choi, male    DOB: 14-Nov-1960, 59 y.o.   MRN: [id number]  HPI  Here to f/u; overall doing ok,  Pt denies chest pain, increasing sob or doe, wheezing, orthopnea, PND, increased LE swelling, palpitations, dizziness or syncope.  Pt denies new neurological symptoms such as new headache, or facial or extremity weakness or numbness.  Pt denies polydipsia, polyuria, or low sugar episode.  Pt states overall good compliance with meds, mostly trying to follow appropriate diet, with wt overall stable,  but little exercise however. Right leg pain improved, tolerating eliquis well, no overt bleeding.  Still has now mild to mod right plantar tendonitis symptoms better in the past wk, not sure if he wants to see surgury, but also needs pulm for f/u OSA as machine is now > 10 yrs.  BP has been mild elev at home as well with wt gain. Gained wt with less active with right foot pain, now improved.   Wt Readings from Last 3 Encounters:  09/23/19 299 lb (135.6 kg)  09/10/19 299 lb (135.6 kg)  03/25/19 293 lb (132.9 kg)   BP Readings from Last 3 Encounters:  09/23/19 (!) 148/96  09/10/19 (!) 150/92  03/25/19 138/86   Past Medical History:  Diagnosis Date  . ANXIETY 01/17/2007  . DEPRESSION 01/17/2007  . GERD 01/17/2007  . HYPERLIPIDEMIA 01/17/2007  . HYPERTENSION 01/17/2007  . Hypertension   . LOW BACK PAIN 01/17/2007  . NECK PAIN, RIGHT 05/09/2010  . OBSTRUCTIVE SLEEP APNEA 01/17/2007  . Sleep apnea   . URI 05/09/2010   Past Surgical History:  Procedure Laterality Date  . FOOT SURGERY    . s/p bilateral knee arthroscopy  2011   Dr. Gladstone Lighter    reports that he has never smoked. He has never used smokeless tobacco. He reports that he does not drink alcohol and does not use drugs. family history includes Diabetes in an other family member. No Known Allergies Current Outpatient Medications on File Prior to Visit  Medication Sig Dispense Refill  . apixaban (ELIQUIS)  5 MG TABS tablet Take 1 tablet (5 mg total) by mouth 2 (two) times daily. 60 tablet 5  . aspirin 81 MG tablet Take 81 mg by mouth daily.      . cyclobenzaprine (FLEXERIL) 5 MG tablet Take 1 tablet (5 mg total) by mouth 3 (three) times daily as needed for muscle spasms. 40 tablet 2  . NON FORMULARY CPAP 13 advanced    . sildenafil (VIAGRA) 100 MG tablet Take 0.5-1 tablets (50-100 mg total) by mouth daily as needed for erectile dysfunction. 5 tablet 11  . tolterodine (DETROL LA) 4 MG 24 hr capsule Take 1 capsule (4 mg total) by mouth daily. 90 capsule 3  . Vitamin D, Ergocalciferol, (DRISDOL) 1.25 MG (50000 UT) CAPS capsule Take 1 capsule (50,000 Units total) by mouth every 7 (seven) days. 12 capsule 0  . vardenafil (LEVITRA) 20 MG tablet Take 1 tablet (20 mg total) by mouth as needed for erectile dysfunction. 10 tablet 5   No current facility-administered medications on file prior to visit.   Review of Systems All otherwise neg per pt     Objective:   Physical Exam Blood pressure (!) 148/96, pulse 65, temperature 98 F (36.7 C), temperature source Oral, height 5\' 8"  (1.727 m), weight 299 lb (135.6 kg), SpO2 98 %. VS noted,  Constitutional: Pt appears in NAD HENT: Head: NCAT.  Right Ear: External ear  normal.  Left Ear: External ear normal.  Eyes: . Pupils are equal, round, and reactive to light. Conjunctivae and EOM are normal Nose: without d/c or deformity Neck: Neck supple. Gross normal ROM Cardiovascular: Normal rate and regular rhythm.   Pulmonary/Chest: Effort normal and breath sounds without rales or wheezing.  Abd:  Soft, NT, ND, + BS, no organomegaly Neurological: Pt is alert. At baseline orientation, motor grossly intact Skin: Skin is warm. No rashes, other new lesions, no LE edema Psychiatric: Pt behavior is normal without agitation  All otherwise neg per pt= Lab Results  Component Value Date   WBC 8.6 03/25/2019   HGB 14.9 03/25/2019   HCT 45.2 03/25/2019   PLT 201.0  03/25/2019   GLUCOSE 100 (H) 03/25/2019   CHOL 157 03/25/2019   TRIG 120.0 03/25/2019   HDL 38.90 (L) 03/25/2019   LDLCALC 94 03/25/2019   ALT 26 03/25/2019   AST 27 03/25/2019   NA 142 03/25/2019   K 4.3 03/25/2019   CL 107 03/25/2019   CREATININE 1.18 03/25/2019   BUN 12 03/25/2019   CO2 28 03/25/2019   TSH 2.72 03/25/2019   PSA 1.40 03/25/2019   INR 1.18 07/14/2009   VAS [place] LE venous - 6.25.2021  Summary:  RIGHT:  - 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  - No cystic structure found in the popliteal fossa.    LEFT:  - No evidence of common femoral vein obstruction.    *See table(s) above for measurements and observations.   Electronically signed by Deitra Mayo MD on 09/13/2019 at 6:22:23  AM.        Assessment & Plan:

## 2019-09-23 NOTE — Assessment & Plan Note (Addendum)
Stable, no PE symptoms, plan for eliquis for total 6 mo (until late dec 2021)  I spent 31 minutes in preparing to see the patient by review of recent labs, imaging and procedures, obtaining and reviewing separately obtained history, communicating with the patient and family or caregiver, ordering medications, tests or procedures, and documenting clinical information in the EHR including the differential Dx, treatment, and any further evaluation and other management of dvt, htn, right foot pain, OSA

## 2019-09-23 NOTE — Assessment & Plan Note (Signed)
Uncontrolled, for increased lotrel to 5-40, cehck BP at home and nextt visit

## 2019-09-23 NOTE — Patient Instructions (Signed)
OK to continue the eliquis until late dec 2021 (about Mar 16, 2020)  [place] to increase the lotrel to 5-40 mg per day - and check your BP daily for 2 wks starting after 5 days, with goal BP to be les than 140/90 at least, and better would be less than 130/80\  Please consider seeing sports medicine on the first floor for the right foot pain if it gets worse again  Please continue all other medications as before, and refills have been done if requested.  Please have the pharmacy call with any other refills you may need.  Please continue your efforts at being more active, low cholesterol diet, and weight control.  Please keep your appointments with your specialists as you may have planned  You will be contacted regarding the referral for: pulmonary for sleep apnea  Please make an Appointment to return in 6 months, or sooner if needed

## 2019-09-23 NOTE — Assessment & Plan Note (Signed)
For pulm referral 

## 2019-10-08 ENCOUNTER — Encounter: Payer: Self-pay | Admitting: Internal Medicine

## 2020-05-17 ENCOUNTER — Ambulatory Visit: Payer: Self-pay

## 2020-05-17 ENCOUNTER — Other Ambulatory Visit: Payer: Self-pay | Admitting: Occupational Medicine

## 2020-05-17 ENCOUNTER — Other Ambulatory Visit: Payer: Self-pay

## 2020-05-17 DIAGNOSIS — M79671 Pain in right foot: Secondary | ICD-10-CM

## 2020-05-20 ENCOUNTER — Ambulatory Visit (INDEPENDENT_AMBULATORY_CARE_PROVIDER_SITE_OTHER): Payer: Worker's Compensation

## 2020-05-20 ENCOUNTER — Other Ambulatory Visit: Payer: Self-pay

## 2020-05-20 ENCOUNTER — Ambulatory Visit (INDEPENDENT_AMBULATORY_CARE_PROVIDER_SITE_OTHER): Payer: BC Managed Care – PPO | Admitting: Podiatry

## 2020-05-20 DIAGNOSIS — S90121D Contusion of right lesser toe(s) without damage to nail, subsequent encounter: Secondary | ICD-10-CM | POA: Diagnosis not present

## 2020-05-20 DIAGNOSIS — S90121S Contusion of right lesser toe(s) without damage to nail, sequela: Secondary | ICD-10-CM

## 2020-05-20 DIAGNOSIS — S92424A Nondisplaced fracture of distal phalanx of right great toe, initial encounter for closed fracture: Secondary | ICD-10-CM | POA: Diagnosis not present

## 2020-05-20 DIAGNOSIS — S90129A Contusion of unspecified lesser toe(s) without damage to nail, initial encounter: Secondary | ICD-10-CM

## 2020-05-20 MED ORDER — MELOXICAM 15 MG PO TABS: 15.0000 mg | ORAL_TABLET | Freq: Every day | ORAL | 0 refills | Status: AC

## 2020-05-20 NOTE — Patient Instructions (Signed)
Toe Fracture A toe fracture is a break in one of the toe bones (phalanges). This may happen if you:  Drop a heavy object on your toe.  Stub your toe.  Twist your toe.  Exercise the same way too much. What are the signs or symptoms? The main symptoms are swelling and pain in the toe. You may also have:  Bruising.  Stiffness.  Numbness.  A change in the way the toe looks.  Broken bones that poke through the skin.  Blood under the toenail.   How is this treated? Treatments may include:  Taping the broken toe to a toe that is next to it (buddy taping).  Wearing a shoe that has a wide, rigid sole to protect the toe and to limit its movement.  Wearing a cast.  Surgery. This may be needed if the: ? Pieces of broken bone are out of place. ? Bone pokes through the skin.  Physical therapy. Follow these instructions at home: If you have a shoe:  Wear the shoe as told by your doctor. Remove it only as told by your doctor.  Loosen the shoe if your toes tingle, become numb, or turn cold and blue.  Keep the shoe clean and dry. If you have a cast:  Do not put pressure on any part of the cast until it is fully hardened. This may take a few hours.  Do not stick anything inside the cast to scratch your skin.  Check the skin around the cast every day. Tell your doctor about any concerns.  You may put lotion on dry skin around the edges of the cast.  Do not put lotion on the skin under the cast.  Keep the cast clean and dry. Bathing  Do not take baths, swim, or use a hot tub until your doctor says it is okay. Ask your doctor if you can take showers.  If the shoe or cast is not waterproof: ? Do not let it get wet. ? Cover it with a watertight covering when you take a bath or a shower. Activity  Do not use your foot to support your body weight until your doctor says it is okay.  Use crutches as told by your doctor.  Ask your doctor what activities are safe for you  during recovery.  Avoid activities as told by your doctor.  Do exercises as told by your doctor or therapist. Driving  Do not drive or use heavy machinery while taking pain medicine.  Do not drive while wearing a cast on a foot that you use for driving. Managing pain, stiffness, and swelling  Put ice on the injured area if told by your doctor: ? Put ice in a plastic bag. ? Place a towel between your skin and the bag.  If you have a shoe, remove it as told by your doctor.  If you have a cast, place a towel between your cast and the bag. ? Leave the ice on for 20 minutes, 2-3 times per day.  Raise (elevate) the injured area above the level of your heart while you are sitting or lying down.   General instructions  If your toe was taped to a toe that is next to it, follow your doctor's instructions for changing the gauze and tape. Change it more often: ? If the gauze and tape get wet. If this happens, dry the space between the toes. ? If the gauze and tape are too tight and they cause your toe  to become pale or to lose feeling (go numb).  If your doctor did not give you a protective shoe, wear sturdy shoes that support your foot. Your shoes should not: ? Pinch your toes. ? Fit tightly against your toes.  Do not use any tobacco products, including cigarettes, chewing tobacco, or e-cigarettes. These can delay bone healing. If you need help quitting, ask your doctor.  Take medicines only as told by your doctor.  Keep all follow-up visits as told by your doctor. This is important. Contact a doctor if:  Your pain medicine is not helping.  You have a fever.  You notice a bad smell coming from your cast. Get help right away if:  You lose feeling (have numbness) in your toe or foot, and it is getting worse.  Your toe or your foot tingles.  Your toe or your foot gets cold or turns blue.  You have redness or swelling in your toe or foot, and it is getting worse.  You have very  bad pain. Summary  A toe fracture is a break in one of the toe bones.  Use ice and raise your foot. This will help lessen pain and swelling.  Use crutches as told by your doctor. This information is not intended to replace advice given to you by your health care provider. Make sure you discuss any questions you have with your health care provider. Document Revised: 05/10/2017 Document Reviewed: 04/17/2017 Elsevier Patient Education  2021 [employer].

## 2020-05-25 ENCOUNTER — Encounter: Payer: Self-pay | Admitting: Podiatry

## 2020-05-25 ENCOUNTER — Telehealth: Payer: Self-pay | Admitting: Podiatry

## 2020-05-25 NOTE — Progress Notes (Signed)
Subjective:   Patient ID: Joseph Choi, male   DOB: 59 y.o.   MRN: [id number]   HPI 60 year old male presents the office with concerns of a possible broken toe on his right big toe.  He states that he missed his step while at work and then on concrete and he did bend his toe during the injury.  He was originally seen at occupational health and x-rays were performed is not of the results of the x-rays.  He still has discomfort as well swelling to the toe.  No other injuries at the time of the accident.   Review of Systems  All other systems reviewed and are negative.  Past Medical History:  Diagnosis Date  . ANXIETY 01/17/2007  . DEPRESSION 01/17/2007  . GERD 01/17/2007  . HYPERLIPIDEMIA 01/17/2007  . HYPERTENSION 01/17/2007  . Hypertension   . LOW BACK PAIN 01/17/2007  . NECK PAIN, RIGHT 05/09/2010  . OBSTRUCTIVE SLEEP APNEA 01/17/2007  . Sleep apnea   . URI 05/09/2010    Past Surgical History:  Procedure Laterality Date  . FOOT SURGERY    . s/p bilateral knee arthroscopy  2011   Dr. Gladstone Lighter     Current Outpatient Medications:  .  meloxicam (MOBIC) 15 MG tablet, Take 1 tablet (15 mg total) by mouth daily., Disp: 30 tablet, Rfl: 0 .  amLODipine-benazepril (LOTREL) 5-40 MG capsule, Take 1 capsule by mouth daily., Disp: 90 capsule, Rfl: 3 .  apixaban (ELIQUIS) 5 MG TABS tablet, Take 1 tablet (5 mg total) by mouth 2 (two) times daily., Disp: 60 tablet, Rfl: 5 .  aspirin 81 MG tablet, Take 81 mg by mouth daily.  , Disp: , Rfl:  .  cyclobenzaprine (FLEXERIL) 5 MG tablet, Take 1 tablet (5 mg total) by mouth 3 (three) times daily as needed for muscle spasms., Disp: 40 tablet, Rfl: 2 .  NON FORMULARY, CPAP 13 advanced, Disp: , Rfl:  .  sildenafil (VIAGRA) 100 MG tablet, Take 0.5-1 tablets (50-100 mg total) by mouth daily as needed for erectile dysfunction., Disp: 5 tablet, Rfl: 11 .  tolterodine (DETROL LA) 4 MG 24 hr capsule, Take 1 capsule (4 mg total) by mouth daily., Disp: 90  capsule, Rfl: 3 .  vardenafil (LEVITRA) 20 MG tablet, Take 1 tablet (20 mg total) by mouth as needed for erectile dysfunction., Disp: 10 tablet, Rfl: 5 .  Vitamin D, Ergocalciferol, (DRISDOL) 1.25 MG (50000 UT) CAPS capsule, Take 1 capsule (50,000 Units total) by mouth every 7 (seven) days., Disp: 12 capsule, Rfl: 0  No Known Allergies        Objective:  Physical Exam  General: AAO x3, NAD  Dermatological: Skin is warm, dry and supple bilateral. There are no open sores, no preulcerative lesions, no rash or signs of infection present.  Vascular: Dorsalis Pedis artery and Posterior Tibial artery pedal pulses are 2/4 bilateral with immedate capillary fill time. There is no pain with calf compression, swelling, warmth, erythema.   Neruologic: Grossly intact via light touch bilateral.  Musculoskeletal: There is edema to the right hallux and there is tenderness palpation of the toe as well as along the MPJ.  There is decreased range of motion of the first MPJ.  Muscular strength 5/5 in all groups tested bilateral.  Gait: Unassisted, Nonantalgic.       Assessment:   60 year old male with arthritis, concern for possible avulsion fracture     Plan:  -Treatment options discussed including all alternatives, risks, and complications -Etiology  of symptoms were discussed -X-rays were obtained and reviewed with the patient.  Arthritic changes present the first MPJ.  Previous x-rays it did show possible small avulsion fracture versus possible osteophyte. -Given the discomfort as well as the swelling to the toe we will treat as an avulsion fracture.  Recommend immobilization in a surgical shoe which was dispensed today.  Anti-inflammatories as needed.  Ice elevation.  Trula Slade DPM

## 2020-05-25 NOTE — Telephone Encounter (Signed)
Is he able to work in a surgical shoe. Otherwise we can get him a graphite insert if he has to wear a regular shoe.

## 2020-05-25 NOTE — Telephone Encounter (Signed)
Patient needs a letter saying its ok for him to continue to work, with no restrictions

## 2020-05-26 ENCOUNTER — Encounter: Payer: Self-pay | Admitting: Internal Medicine

## 2020-05-27 ENCOUNTER — Telehealth: Payer: Self-pay

## 2020-05-27 MED ORDER — SILDENAFIL CITRATE 100 MG PO TABS: 50.0000 mg | ORAL_TABLET | Freq: Every day | ORAL | 11 refills | Status: DC | PRN

## 2020-05-27 NOTE — Telephone Encounter (Signed)
none

## 2020-05-27 NOTE — Telephone Encounter (Signed)
Ok this is done 

## 2020-05-30 ENCOUNTER — Other Ambulatory Visit: Payer: Self-pay | Admitting: Internal Medicine

## 2020-05-30 MED ORDER — SILDENAFIL CITRATE 100 MG PO TABS: 50.0000 mg | ORAL_TABLET | Freq: Every day | ORAL | 11 refills | Status: DC | PRN

## 2020-06-15 ENCOUNTER — Other Ambulatory Visit: Payer: Self-pay

## 2020-06-15 ENCOUNTER — Ambulatory Visit (INDEPENDENT_AMBULATORY_CARE_PROVIDER_SITE_OTHER): Payer: BC Managed Care – PPO

## 2020-06-15 ENCOUNTER — Ambulatory Visit: Payer: BC Managed Care – PPO | Admitting: Podiatry

## 2020-06-15 ENCOUNTER — Encounter: Payer: Self-pay | Admitting: Podiatry

## 2020-06-15 DIAGNOSIS — S90121D Contusion of right lesser toe(s) without damage to nail, subsequent encounter: Secondary | ICD-10-CM

## 2020-06-15 DIAGNOSIS — M2021 Hallux rigidus, right foot: Secondary | ICD-10-CM | POA: Diagnosis not present

## 2020-06-15 DIAGNOSIS — S90111D Contusion of right great toe without damage to nail, subsequent encounter: Secondary | ICD-10-CM

## 2020-06-15 DIAGNOSIS — S90129A Contusion of unspecified lesser toe(s) without damage to nail, initial encounter: Secondary | ICD-10-CM

## 2020-06-15 NOTE — Patient Instructions (Signed)
Look at using "fungi-nail" for the toenails

## 2020-06-17 NOTE — Progress Notes (Signed)
Subjective: 60 year old male presents the office for follow-up evaluation of possible fracture to his right big toe.  Is been in the surgical shoe since the last saw him his pain is much improved as well as the swelling.  Currently not expensing any discomfort.  He has been at work but doing desk work.  He has no other concerns.  Had secondary concerns of toenail fungus.  No pain with the nails.  Denies any systemic complaints such as fevers, chills, nausea, vomiting. No acute changes since last appointment, and no other complaints at this time.   Objective: AAO x3, NAD DP/PT pulses palpable bilaterally, CRT less than 3 seconds At this time there is no tenderness palpation on the right hallux.  There is no sign of edema there is no erythema.  No open lesions.  There is decreased range of motion of the first MPJ and hallux rigidus is noted.  Otherwise no other areas of discomfort.  MMT 5/5. Nails are hypertrophic, dystrophic with yellow-brown discoloration.  No pain the nails there is no edema, erythema or signs of infection. No pain with calf compression, swelling, warmth, erythema  Assessment: 60 year old male likely healed fracture right big toe, hallux rigidus/capsulitis; onychomycosis  Plan: -All treatment options discussed with the patient including all alternatives, risks, complications.  -Repeat x-rays obtained reviewed.  Arthritic changes present versus BKA.  No evidence of acute fracture identified. -At this point he is doing well can transition back into regular shoe as tolerated.  Continue to ice and elevate. -We discussed the arthritis in his big toe joint.  Discussed conservative as well surgical options.  Discussed wearing stiffer soled shoe.  In the future if it becomes symptomatic first MPJ arthrodesis. -Discussed Fungi-Nail first toenail fungus.  Trula Slade DPM

## 2020-07-29 ENCOUNTER — Encounter: Payer: Self-pay | Admitting: Internal Medicine

## 2020-07-29 ENCOUNTER — Ambulatory Visit (INDEPENDENT_AMBULATORY_CARE_PROVIDER_SITE_OTHER): Payer: BC Managed Care – PPO | Admitting: Internal Medicine

## 2020-07-29 ENCOUNTER — Other Ambulatory Visit: Payer: Self-pay

## 2020-07-29 VITALS — BP 152/100 | HR 78 | Temp 98.8°F | Ht 68.0 in | Wt 290.0 lb

## 2020-07-29 DIAGNOSIS — I10 Essential (primary) hypertension: Secondary | ICD-10-CM | POA: Diagnosis not present

## 2020-07-29 DIAGNOSIS — E559 Vitamin D deficiency, unspecified: Secondary | ICD-10-CM | POA: Diagnosis not present

## 2020-07-29 DIAGNOSIS — G4733 Obstructive sleep apnea (adult) (pediatric): Secondary | ICD-10-CM

## 2020-07-29 DIAGNOSIS — R350 Frequency of micturition: Secondary | ICD-10-CM | POA: Diagnosis not present

## 2020-07-29 DIAGNOSIS — Z125 Encounter for screening for malignant neoplasm of prostate: Secondary | ICD-10-CM

## 2020-07-29 DIAGNOSIS — Z0001 Encounter for general adult medical examination with abnormal findings: Secondary | ICD-10-CM | POA: Diagnosis not present

## 2020-07-29 DIAGNOSIS — R739 Hyperglycemia, unspecified: Secondary | ICD-10-CM | POA: Diagnosis not present

## 2020-07-29 LAB — CBC WITH DIFFERENTIAL/PLATELET
Basophils Absolute: 0 10*3/uL (ref 0.0–0.1)
Basophils Relative: 0.4 % (ref 0.0–3.0)
Eosinophils Absolute: 0.1 10*3/uL (ref 0.0–0.7)
Eosinophils Relative: 1 % (ref 0.0–5.0)
HCT: 43.7 % (ref 39.0–52.0)
Hemoglobin: 14.8 g/dL (ref 13.0–17.0)
Lymphocytes Relative: 18 % (ref 12.0–46.0)
Lymphs Abs: 1.9 10*3/uL (ref 0.7–4.0)
MCHC: 33.9 g/dL (ref 30.0–36.0)
MCV: 88.3 fl (ref 78.0–100.0)
Monocytes Absolute: 0.6 10*3/uL (ref 0.1–1.0)
Monocytes Relative: 5.6 % (ref 3.0–12.0)
Neutro Abs: 7.7 10*3/uL (ref 1.4–7.7)
Neutrophils Relative %: 75 % (ref 43.0–77.0)
Platelets: 204 10*3/uL (ref 150.0–400.0)
RBC: 4.95 Mil/uL (ref 4.22–5.81)
RDW: 13.2 % (ref 11.5–15.5)
WBC: 10.3 10*3/uL (ref 4.0–10.5)

## 2020-07-29 LAB — HEPATIC FUNCTION PANEL
ALT: 19 U/L (ref 0–53)
AST: 19 U/L (ref 0–37)
Albumin: 4.3 g/dL (ref 3.5–5.2)
Alkaline Phosphatase: 88 U/L (ref 39–117)
Bilirubin, Direct: 0.1 mg/dL (ref 0.0–0.3)
Total Bilirubin: 0.7 mg/dL (ref 0.2–1.2)
Total Protein: 7.4 g/dL (ref 6.0–8.3)

## 2020-07-29 LAB — LIPID PANEL
Cholesterol: 135 mg/dL (ref 0–200)
HDL: 32.6 mg/dL — ABNORMAL LOW (ref >39.00)
LDL Cholesterol: 85 mg/dL (ref 0–99)
NonHDL: 102.32
Total CHOL/HDL Ratio: 4
Triglycerides: 87 mg/dL (ref 0.0–149.0)
VLDL: 17.4 mg/dL (ref 0.0–40.0)

## 2020-07-29 LAB — URINALYSIS, ROUTINE W REFLEX MICROSCOPIC
Bilirubin Urine: NEGATIVE
Hgb urine dipstick: NEGATIVE
Ketones, ur: NEGATIVE
Leukocytes,Ua: NEGATIVE
Nitrite: NEGATIVE
RBC / HPF: NONE SEEN (ref 0–?)
Specific Gravity, Urine: 1.015 (ref 1.000–1.030)
Total Protein, Urine: NEGATIVE
Urine Glucose: NEGATIVE
Urobilinogen, UA: 0.2 (ref 0.0–1.0)
pH: 7.5 (ref 5.0–8.0)

## 2020-07-29 LAB — BASIC METABOLIC PANEL
BUN: 16 mg/dL (ref 6–23)
CO2: 29 mEq/L (ref 19–32)
Calcium: 9.4 mg/dL (ref 8.4–10.5)
Chloride: 107 mEq/L (ref 96–112)
Creatinine, Ser: 1.37 mg/dL (ref 0.40–1.50)
GFR: 55.47 mL/min — ABNORMAL LOW (ref >60.00)
Glucose, Bld: 91 mg/dL (ref 70–99)
Potassium: 4.3 mEq/L (ref 3.5–5.1)
Sodium: 142 mEq/L (ref 135–145)

## 2020-07-29 LAB — VITAMIN D 25 HYDROXY (VIT D DEFICIENCY, FRACTURES): VITD: 42.13 ng/mL (ref 30.00–100.00)

## 2020-07-29 LAB — HEMOGLOBIN A1C: Hgb A1c MFr Bld: 5.7 % (ref 4.6–6.5)

## 2020-07-29 LAB — TSH: TSH: 2.23 u[IU]/mL (ref 0.35–4.50)

## 2020-07-29 LAB — PSA: PSA: 1.45 ng/mL (ref 0.10–4.00)

## 2020-07-29 MED ORDER — METOPROLOL SUCCINATE ER 50 MG PO TB24
50.0000 mg | ORAL_TABLET | Freq: Every day | ORAL | 3 refills | Status: DC
Start: 2020-07-29 — End: 2020-12-01

## 2020-07-29 NOTE — Progress Notes (Signed)
Patient ID: Joseph Choi, male   DOB: [birth year], 60 y.o.   MRN: [id number]         Chief Complaint:: wellness exam and Office Visit (Discuss blood pressure)  , hyperglycemia, urinary frequency, low vit d, and osa       HPI:  Joseph Choi is a 60 y.o. 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  Also less excericse recently due to foot pain, seeing podiatry for avulsion fracture; still trying to go to the gym with recumbent bike, wt still able to get down 10 lbs over last yr with better diet. Asks for nutrition referral as well.  Has not seen pulmonary for OSA in many years, needs referral.  Not taking Vit d 2000 u qd  Has also worsening urinary frequency, but thinks possibly due to increased oral fluids trying to work on wt loss.   Pt denies polydipsia, polyuria, or new focal neuro s/s.  Pt denies chest pain, increased sob or doe, wheezing, orthopnea, PND, increased LE swelling, palpitations, dizziness or syncope.  Overall good med complaince.  No other new complaints   Wt Readings from Last 3 Encounters:  07/29/20 290 lb (131.5 kg)  09/23/19 299 lb (135.6 kg)  09/10/19 299 lb (135.6 kg)   BP Readings from Last 3 Encounters:  07/29/20 (!) 152/100  09/23/19 (!) 148/96  09/10/19 (!) 150/92   Immunization History  Administered Date(s) Administered  . Influenza Whole 02/20/2007  . Influenza,inj,Quad PF,6+ Mos 12/09/2013, 12/07/2016, 02/08/2018, 12/19/2018  . PFIZER(Purple Top)SARS-COV-2 Vaccination 05/28/2019, 06/28/2019, 12/29/2019  . Td 03/04/2008  . Tdap 09/27/2018   There are no preventive care reminders to display for this patient.    Past Medical History:  Diagnosis Date  . ANXIETY 01/17/2007  . DEPRESSION 01/17/2007  . GERD 01/17/2007  . HYPERLIPIDEMIA 01/17/2007  . HYPERTENSION 01/17/2007  . Hypertension   . LOW BACK PAIN  01/17/2007  . NECK PAIN, RIGHT 05/09/2010  . OBSTRUCTIVE SLEEP APNEA 01/17/2007  . Sleep apnea   . URI 05/09/2010   Past Surgical History:  Procedure Laterality Date  . FOOT SURGERY    . s/p bilateral knee arthroscopy  2011   Dr. Gladstone Lighter    reports that he has never smoked. He has never used smokeless tobacco. He reports that he does not drink alcohol and does not use drugs. family history includes Diabetes in an other family member. No Known Allergies Current Outpatient Medications on File Prior to Visit  Medication Sig Dispense Refill  . amLODipine-benazepril (LOTREL) 5-40 MG capsule Take 1 capsule by mouth daily. 90 capsule 3  . aspirin 81 MG tablet Take 81 mg by mouth daily.    . meloxicam (MOBIC) 15 MG tablet Take 1 tablet (15 mg total) by mouth daily. 30 tablet 0  . NON FORMULARY CPAP 13 advanced    . sildenafil (VIAGRA) 100 MG tablet Take 0.5-1 tablets (50-100 mg total) by mouth daily as needed for erectile dysfunction. 5 tablet 11   No current facility-administered medications on file prior to visit.        ROS:  All others reviewed and negative.  Objective        PE:  BP (!) 152/100 (BP Location: Right Arm, Patient Position: Sitting, Cuff Size: Large)   Pulse 78   Temp 98.8 F (37.1 C) (Oral)  Ht 5\' 8"  (1.727 m)   Wt 290 lb (131.5 kg)   SpO2 98%   BMI 44.09 kg/m                 Constitutional: Pt appears in NAD               HENT: Head: NCAT.                Right Ear: External ear normal.                 Left Ear: External ear normal.                Eyes: . Pupils are equal, round, and reactive to light. Conjunctivae and EOM are normal               Nose: without d/c or deformity               Neck: Neck supple. Gross normal ROM               Cardiovascular: Normal rate and regular rhythm.                 Pulmonary/Chest: Effort normal and breath sounds without rales or wheezing.                Abd:  Soft, NT, ND, + BS, no organomegaly                Neurological: Pt is alert. At baseline orientation, motor grossly intact               Skin: Skin is warm. No rashes, no other new lesions, LE edema - none              Psychiatric: Pt behavior is normal without agitation   Micro: none  Cardiac tracings I have personally interpreted today:  none  Pertinent Radiological findings (summarize): none   Lab Results  Component Value Date   WBC 10.3 07/29/2020   HGB 14.8 07/29/2020   HCT 43.7 07/29/2020   PLT 204.0 07/29/2020   GLUCOSE 91 07/29/2020   CHOL 135 07/29/2020   TRIG 87.0 07/29/2020   HDL 32.60 (L) 07/29/2020   LDLCALC 85 07/29/2020   ALT 19 07/29/2020   AST 19 07/29/2020   NA 142 07/29/2020   K 4.3 07/29/2020   CL 107 07/29/2020   CREATININE 1.37 07/29/2020   BUN 16 07/29/2020   CO2 29 07/29/2020   TSH 2.23 07/29/2020   PSA 1.45 07/29/2020   INR 1.18 07/14/2009   HGBA1C 5.7 07/29/2020   Assessment/Plan:  Joseph Choi is a 60 y.o. Black or African American [2] male with  has a past medical history of ANXIETY (01/17/2007), DEPRESSION (01/17/2007), GERD (01/17/2007), HYPERLIPIDEMIA (01/17/2007), HYPERTENSION (01/17/2007), Hypertension, LOW BACK PAIN (01/17/2007), NECK PAIN, RIGHT (05/09/2010), OBSTRUCTIVE SLEEP APNEA (01/17/2007), Sleep apnea, and URI (05/09/2010).  Vitamin D deficiency Last vitamin D Lab Results  Component Value Date   VD25OH 24.98 (L) 03/25/2019   Low to start oral replacement   Encounter for well adult exam with abnormal findings Age and sex appropriate education and counseling updated with regular exercise and diet Referrals for preventative services - none needed Immunizations addressed - none needed Smoking counseling  - none needed Evidence for depression or other mood disorder - none significant Most recent labs reviewed. I have personally reviewed and have noted: 1) the patient's medical and social history 2) The patient's current medications and  supplements 3) The patient's height,  weight, and BMI have been recorded in the chart   Urinary frequency Etiology unclear, could be related to simply increased oral fluids, doubt OAB, but also for UA with labs  OBSTRUCTIVE SLEEP APNEA Stable with good compliance, pt for referral for f/u to pulmonary  Hypertension, uncontrolled Mild uncontrolled, to add toprol x 50 qd,  to f/u any worsening symptoms or concerns  Hyperglycemia Lab Results  Component Value Date   HGBA1C 5.7 07/29/2020   Stable, pt to continue current medical treatment  - diet and wt control, also refer nutrition   Followup: Return in about 6 months (around 01/29/2021).  Cathlean Cower, MD 07/31/2020 4:53 PM [facility]

## 2020-07-29 NOTE — Assessment & Plan Note (Signed)
Last vitamin D Lab Results  Component Value Date   VD25OH 24.98 (L) 03/25/2019   Low to start oral replacement

## 2020-07-29 NOTE — Patient Instructions (Signed)
Please take all new medication as prescribed - the toprol xl 50 mg per day  Please continue all other medications as before, and refills have been done if requested.  Please have the pharmacy call with any other refills you may need.  Please continue your efforts at being more active, low cholesterol diet, and weight control.  You are otherwise up to date with prevention measures today.  Please keep your appointments with your specialists as you may have planned  You will be contacted regarding the referral for: Nutrition for the diet, and Pulmonary for the sleep apnea  Please go to the LAB at the blood drawing area for the tests to be done  You will be contacted by phone if any changes need to be made immediately.  Otherwise, you will receive a letter about your results with an explanation, but please check with MyChart first.  Please remember to sign up for MyChart if you have not done so, as this will be important to you in the future with finding out test results, communicating by private email, and scheduling acute appointments online when needed.  Please make an Appointment to return in 6 months, or sooner if needed

## 2020-07-31 ENCOUNTER — Encounter: Payer: Self-pay | Admitting: Internal Medicine

## 2020-07-31 NOTE — Assessment & Plan Note (Signed)
Stable with good compliance, pt for referral for f/u to pulmonary

## 2020-07-31 NOTE — Assessment & Plan Note (Signed)
Mild uncontrolled, to add toprol x 50 qd,  to f/u any worsening symptoms or concerns

## 2020-07-31 NOTE — Assessment & Plan Note (Signed)

## 2020-07-31 NOTE — Assessment & Plan Note (Addendum)
Lab Results  Component Value Date   HGBA1C 5.7 07/29/2020   Stable, pt to continue current medical treatment  - diet and wt control, also refer nutrition

## 2020-07-31 NOTE — Assessment & Plan Note (Signed)
Etiology unclear, could be related to simply increased oral fluids, doubt OAB, but also for UA with labs

## 2020-08-05 ENCOUNTER — Ambulatory Visit (INDEPENDENT_AMBULATORY_CARE_PROVIDER_SITE_OTHER): Payer: BC Managed Care – PPO

## 2020-08-05 ENCOUNTER — Other Ambulatory Visit: Payer: Self-pay

## 2020-08-05 ENCOUNTER — Ambulatory Visit (INDEPENDENT_AMBULATORY_CARE_PROVIDER_SITE_OTHER): Payer: Worker's Compensation | Admitting: Podiatry

## 2020-08-05 DIAGNOSIS — M778 Other enthesopathies, not elsewhere classified: Secondary | ICD-10-CM

## 2020-08-05 DIAGNOSIS — M2021 Hallux rigidus, right foot: Secondary | ICD-10-CM | POA: Diagnosis not present

## 2020-08-05 DIAGNOSIS — M79671 Pain in right foot: Secondary | ICD-10-CM

## 2020-08-05 MED ORDER — TRIAMCINOLONE ACETONIDE 10 MG/ML IJ SUSP
10.0000 mg | Freq: Once | INTRAMUSCULAR | Status: AC
  Administered 2020-08-05: 10 mg

## 2020-08-09 NOTE — Progress Notes (Signed)
Subjective: 60 year old male presents the office today for concerns of continued pain on the right big toe joint.  He states that he likes to do a lot of walking but unfortunate has not been able to do so because of discomfort.  He had an injury at work that started the issue.  Prior to that he was able to walk and do daily activities without significant discomfort but now he has had limited activity given the discomfort. Denies any systemic complaints such as fevers, chills, nausea, vomiting. No acute changes since last appointment, and no other complaints at this time.   Objective: AAO x3, NAD DP/PT pulses palpable bilaterally, CRT less than 3 seconds Right foot: Tenderness palpation metatarsal region with decreased range of motion.  There is mild edema of the first MPJ but there is no erythema or warmth.  No other areas of discomfort identified today.  MMT 5/5.  No pain with calf compression, swelling, warmth, erythema  Assessment: Right foot hallux rigidus, capsulitis  Plan: -All treatment options discussed with the patient including all alternatives, risks, complications.  -Repeat x-rays reveal arthritic changes present the first MPJ but no evidence of acute fracture today. -Steroid injection performed.  Skin was prepped with Betadine and mixture of 1 cc Kenalog 10, 1 cc lidocaine plain was infiltrated into and around the first MPJ without complications.  Postinjection care discussed.  Tolerated well. -Discussed other treatment options including surgery versus conservative treatment.  We will try to see if he can make an orthotic with a Morton's extension.  We will check with Worker's Compensation prior to ordering. -Patient encouraged to call the office with any questions, concerns, change in symptoms.   Trula Slade DPM

## 2020-08-10 ENCOUNTER — Telehealth: Payer: Self-pay | Admitting: Podiatry

## 2020-08-10 NOTE — Telephone Encounter (Signed)
Left message for pt to call to discuss orthotics that Dr Jacqualyn Posey has recommended.

## 2020-09-02 ENCOUNTER — Ambulatory Visit: Payer: BC Managed Care – PPO | Admitting: Podiatry

## 2020-09-03 ENCOUNTER — Encounter: Payer: Self-pay | Admitting: Internal Medicine

## 2020-09-16 ENCOUNTER — Other Ambulatory Visit: Payer: Self-pay | Admitting: Internal Medicine

## 2020-09-16 NOTE — Telephone Encounter (Signed)
Please refill as per office routine med refill policy (all routine meds refilled for 3 mo or monthly per pt preference up to one year from last visit, then month to month grace period for 3 mo, then further med refills will have to be denied)  

## 2020-09-22 ENCOUNTER — Encounter: Payer: Self-pay | Admitting: Internal Medicine

## 2020-09-22 ENCOUNTER — Ambulatory Visit: Payer: BC Managed Care – PPO | Admitting: Internal Medicine

## 2020-09-22 ENCOUNTER — Other Ambulatory Visit: Payer: Self-pay

## 2020-09-22 VITALS — BP 158/90 | HR 58 | Temp 98.1°F | Resp 98 | Ht 68.0 in | Wt 289.0 lb

## 2020-09-22 DIAGNOSIS — R350 Frequency of micturition: Secondary | ICD-10-CM

## 2020-09-22 DIAGNOSIS — N289 Disorder of kidney and ureter, unspecified: Secondary | ICD-10-CM | POA: Diagnosis not present

## 2020-09-22 DIAGNOSIS — I10 Essential (primary) hypertension: Secondary | ICD-10-CM

## 2020-09-22 DIAGNOSIS — R739 Hyperglycemia, unspecified: Secondary | ICD-10-CM | POA: Diagnosis not present

## 2020-09-22 LAB — BASIC METABOLIC PANEL
BUN: 16 mg/dL (ref 6–23)
CO2: 29 mEq/L (ref 19–32)
Calcium: 9.5 mg/dL (ref 8.4–10.5)
Chloride: 106 mEq/L (ref 96–112)
Creatinine, Ser: 1.38 mg/dL (ref 0.40–1.50)
GFR: 54.93 mL/min — ABNORMAL LOW (ref >60.00)
Glucose, Bld: 93 mg/dL (ref 70–99)
Potassium: 4.4 mEq/L (ref 3.5–5.1)
Sodium: 141 mEq/L (ref 135–145)

## 2020-09-22 LAB — URINALYSIS, ROUTINE W REFLEX MICROSCOPIC
Bilirubin Urine: NEGATIVE
Hgb urine dipstick: NEGATIVE
Ketones, ur: NEGATIVE
Leukocytes,Ua: NEGATIVE
Nitrite: NEGATIVE
Specific Gravity, Urine: 1.015 (ref 1.000–1.030)
Total Protein, Urine: NEGATIVE
Urine Glucose: NEGATIVE
Urobilinogen, UA: 0.2 (ref 0.0–1.0)
pH: 7 (ref 5.0–8.0)

## 2020-09-22 MED ORDER — TAMSULOSIN HCL 0.4 MG PO CAPS: 0.4000 mg | ORAL_CAPSULE | Freq: Every day | ORAL | 3 refills | Status: DC

## 2020-09-22 MED ORDER — OLMESARTAN MEDOXOMIL 40 MG PO TABS: 40.0000 mg | ORAL_TABLET | Freq: Every day | ORAL | 3 refills | Status: DC

## 2020-09-22 NOTE — Progress Notes (Signed)
Patient ID: Joseph Choi, male   DOB: October 18, 1960, 60 y.o.   MRN: [id number]        Chief Complaint: follow up HTN, urinary frequency, renal insuffiieny       HPI:  Joseph Choi is a 60 y.o. male here with 2 mo worsening urinary frequency and somewhat slowed stream but no retention, and Denies urinary symptoms such as dysuria, urgency, flank pain, hematuria or n/v, fever, chills.  Has not changed diet or fluid intake.  BP has been hard to control.  Recent labs indicate new onset mild renal insufficiency of which he is unaware.  Wt down 10 lbs from 1 yr ago.  Has been working on diet himself, did not see the nutritionist so far even though they called, but he did not answer   Pt denies chest pain, increased sob or doe, wheezing, orthopnea, PND, increased LE swelling, palpitations, dizziness or syncope.   Pt denies polydipsia, polyuria, or new focal neuro s/s.   Pt denies fever, wt loss, night sweats, loss of appetite, or other constitutional symptoms  Wt Readings from Last 3 Encounters:  09/22/20 289 lb (131.1 kg)  07/29/20 290 lb (131.5 kg)  09/23/19 299 lb (135.6 kg)   BP Readings from Last 3 Encounters:  09/22/20 (!) 158/90  07/29/20 (!) 152/100  09/23/19 (!) 148/96         Past Medical History:  Diagnosis Date   ANXIETY 01/17/2007   DEPRESSION 01/17/2007   GERD 01/17/2007   HYPERLIPIDEMIA 01/17/2007   HYPERTENSION 01/17/2007   Hypertension    LOW BACK PAIN 01/17/2007   NECK PAIN, RIGHT 05/09/2010   OBSTRUCTIVE SLEEP APNEA 01/17/2007   Sleep apnea    URI 05/09/2010   Past Surgical History:  Procedure Laterality Date   FOOT SURGERY     s/p bilateral knee arthroscopy  2011   Dr. Gladstone Lighter    reports that he has never smoked. He has never used smokeless tobacco. He reports that he does not drink alcohol and does not use drugs. family history includes Diabetes in an other family member. No Known Allergies Current Outpatient Medications on File Prior to Visit  Medication Sig  Dispense Refill   aspirin 81 MG tablet Take 81 mg by mouth daily.     meloxicam (MOBIC) 15 MG tablet Take 1 tablet (15 mg total) by mouth daily. 30 tablet 0   metoprolol succinate (TOPROL-XL) 50 MG 24 hr tablet Take 1 tablet (50 mg total) by mouth daily. Take with or immediately following a meal. 90 tablet 3   NON FORMULARY CPAP 13 advanced     sildenafil (VIAGRA) 100 MG tablet Take 0.5-1 tablets (50-100 mg total) by mouth daily as needed for erectile dysfunction. 5 tablet 11   No current facility-administered medications on file prior to visit.        ROS:  All others reviewed and negative.  Objective        PE:  BP (!) 158/90 (BP Location: Right Arm, Patient Position: Sitting, Cuff Size: Large)   Pulse (!) 58   Temp 98.1 F (36.7 C) (Oral)   Resp (!) 98   Ht 5\' 8"  (1.727 m)   Wt 289 lb (131.1 kg)   BMI 43.94 kg/m                 Constitutional: Pt appears in NAD               HENT: Head: NCAT.  Right Ear: External ear normal.                 Left Ear: External ear normal.                Eyes: . Pupils are equal, round, and reactive to light. Conjunctivae and EOM are normal               Nose: without d/c or deformity               Neck: Neck supple. Gross normal ROM               Cardiovascular: Normal rate and regular rhythm.                 Pulmonary/Chest: Effort normal and breath sounds without rales or wheezing.                Abd:  Soft, NT, ND, + BS, no organomegaly               Neurological: Pt is alert. At baseline orientation, motor grossly intact               Skin: Skin is warm. No rashes, no other new lesions, LE edema - none               Psychiatric: Pt behavior is normal without agitation   Micro: none  Cardiac tracings I have personally interpreted today:  none  Pertinent Radiological findings (summarize): none   Lab Results  Component Value Date   WBC 10.3 07/29/2020   HGB 14.8 07/29/2020   HCT 43.7 07/29/2020   PLT 204.0 07/29/2020    GLUCOSE 93 09/22/2020   CHOL 135 07/29/2020   TRIG 87.0 07/29/2020   HDL 32.60 (L) 07/29/2020   LDLCALC 85 07/29/2020   ALT 19 07/29/2020   AST 19 07/29/2020   NA 141 09/22/2020   K 4.4 09/22/2020   CL 106 09/22/2020   CREATININE 1.38 09/22/2020   BUN 16 09/22/2020   CO2 29 09/22/2020   TSH 2.23 07/29/2020   PSA 1.45 07/29/2020   INR 1.18 07/14/2009   HGBA1C 5.7 07/29/2020   Assessment/Plan:  Joseph Choi is a 60 y.o. Black or African American [2] male with  has a past medical history of ANXIETY (01/17/2007), DEPRESSION (01/17/2007), GERD (01/17/2007), HYPERLIPIDEMIA (01/17/2007), HYPERTENSION (01/17/2007), Hypertension, LOW BACK PAIN (01/17/2007), NECK PAIN, RIGHT (05/09/2010), OBSTRUCTIVE SLEEP APNEA (01/17/2007), Sleep apnea, and URI (05/09/2010).  Hypertension, uncontrolled Mod to severe, hard to control, add benicar 40 qd, f/u BP at home and next visit  Renal insufficiency Unclear if represent new ckd 3 - for f/u BMP today  Urinary frequency Suspect mild prostatism, though cant r/o oab - for UA and culture, trial flomax, and consider vesicare if not improved  Hyperglycemia Lab Results  Component Value Date   HGBA1C 5.7 07/29/2020   Stable, pt to continue current medical treatment  - diet  Followup: Return in about 3 months (around 12/23/2020).  Cathlean Cower, MD 09/22/2020 9:36 PM [facility]

## 2020-09-22 NOTE — Assessment & Plan Note (Signed)
Lab Results  Component Value Date   HGBA1C 5.7 07/29/2020   Stable, pt to continue current medical treatment  - diet

## 2020-09-22 NOTE — Patient Instructions (Signed)
Please take all new medication as prescribed- - the flomax for prostate, and the benicar 40 mg for blood pressure  Please continue all other medications as before, and refills have been done if requested.  Please have the pharmacy call with any other refills you may need.  Please continue your efforts at being more active, low cholesterol diet, and weight control  Please keep your appointments with your specialists as you may have planned  Please go to the LAB at the blood drawing area for the tests to be done - just the urine and kidney testing today  You will be contacted by phone if any changes need to be made immediately.  Otherwise, you will receive a letter about your results with an explanation, but please check with MyChart first.  Please remember to sign up for MyChart if you have not done so, as this will be important to you in the future with finding out test results, communicating by private email, and scheduling acute appointments online when needed.  Please make an Appointment to return in 3 months

## 2020-09-22 NOTE — Assessment & Plan Note (Signed)
Suspect mild prostatism, though cant r/o oab - for UA and culture, trial flomax, and consider vesicare if not improved

## 2020-09-22 NOTE — Assessment & Plan Note (Signed)
Mod to severe, hard to control, add benicar 40 qd, f/u BP at home and next visit

## 2020-09-22 NOTE — Assessment & Plan Note (Signed)
Unclear if represent new ckd 3 - for f/u BMP today

## 2020-09-23 LAB — URINE CULTURE: Result:: NO GROWTH

## 2020-11-30 ENCOUNTER — Telehealth: Payer: Self-pay | Admitting: *Deleted

## 2020-11-30 NOTE — Telephone Encounter (Signed)
Patient is calling to request a letter to go back to work even though he only missed one day off. Please advise.

## 2020-12-01 ENCOUNTER — Encounter: Payer: Self-pay | Admitting: Internal Medicine

## 2020-12-01 ENCOUNTER — Ambulatory Visit: Payer: BC Managed Care – PPO | Admitting: Internal Medicine

## 2020-12-01 ENCOUNTER — Telehealth: Payer: Self-pay | Admitting: Podiatry

## 2020-12-01 ENCOUNTER — Other Ambulatory Visit: Payer: Self-pay

## 2020-12-01 VITALS — BP 170/92 | HR 66 | Temp 98.7°F | Ht 68.0 in | Wt 276.0 lb

## 2020-12-01 DIAGNOSIS — Z23 Encounter for immunization: Secondary | ICD-10-CM

## 2020-12-01 DIAGNOSIS — N401 Enlarged prostate with lower urinary tract symptoms: Secondary | ICD-10-CM

## 2020-12-01 DIAGNOSIS — R739 Hyperglycemia, unspecified: Secondary | ICD-10-CM | POA: Diagnosis not present

## 2020-12-01 DIAGNOSIS — E559 Vitamin D deficiency, unspecified: Secondary | ICD-10-CM | POA: Diagnosis not present

## 2020-12-01 DIAGNOSIS — I10 Essential (primary) hypertension: Secondary | ICD-10-CM | POA: Diagnosis not present

## 2020-12-01 MED ORDER — METOPROLOL SUCCINATE ER 50 MG PO TB24: 50.0000 mg | ORAL_TABLET | Freq: Every day | ORAL | 3 refills | Status: DC

## 2020-12-01 MED ORDER — OLMESARTAN MEDOXOMIL 40 MG PO TABS: 40.0000 mg | ORAL_TABLET | Freq: Every day | ORAL | 3 refills | Status: DC

## 2020-12-01 MED ORDER — SILODOSIN 4 MG PO CAPS: 4.0000 mg | ORAL_CAPSULE | Freq: Every day | ORAL | 3 refills | Status: DC

## 2020-12-01 MED ORDER — HYDROCHLOROTHIAZIDE 12.5 MG PO CAPS: 12.5000 mg | ORAL_CAPSULE | Freq: Every day | ORAL | 3 refills | Status: DC

## 2020-12-01 NOTE — Telephone Encounter (Signed)
Patient called the office stating he hasn't been seen in the office since 09/02/2020 and he needs a note for work  , for his company records .

## 2020-12-01 NOTE — Patient Instructions (Addendum)
You had the flu shot today  Please take all new medication as prescribed  - the rapaflo instead of the flomax for the prostate  Please take all new medication as prescribed - the HCT 12.5 mg per day  OK to start te Toprol XL 50 mg per day for blood pressure  Please continue all other medications as before, including the Benicar  Please have the pharmacy call with any other refills you may need.  Please keep your appointments with your specialists as you may have planned  Please call in 2 wks for possible amlodipine if the BP is still not usually < 140/90

## 2020-12-01 NOTE — Progress Notes (Addendum)
Patient ID: Joseph Choi, male   DOB: 1960-09-01, 60 y.o.   MRN: [id number]        Chief Complaint: follow up HTN, hyperglycemia, BPH, vit d def        HPI:  Joseph Choi is a 60 y.o. male here with home BP meaures in the mildly elevated range in the past month.  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   Somehow seems to have become confused about his meds, and is not taking the toprol xl  Lost 14 lbs with better diet and exercise so is trying to work on it  Pt denies chest pain, increased sob or doe, wheezing, orthopnea, PND, increased LE swelling, palpitations, dizziness or syncope.   Pt denies polydipsia, polyuria, or new focal neuro s/s.   Pt denies fever, night sweats, loss of appetite, or other constitutional symptoms  Does have worsening urinary slower flow despite good flomax compliance and Denies urinary symptoms such as dysuria, frequency, urgency, flank pain, hematuria or n/v, fever, chills.  Due for flu shot  Wt Readings from Last 3 Encounters:  12/01/20 276 lb (125.2 kg)  09/22/20 289 lb (131.1 kg)  07/29/20 290 lb (131.5 kg)   BP Readings from Last 3 Encounters:  12/01/20 (!) 170/92  09/22/20 (!) 158/90  07/29/20 (!) 152/100         Past Medical History:  Diagnosis Date   ANXIETY 01/17/2007   DEPRESSION 01/17/2007   GERD 01/17/2007   HYPERLIPIDEMIA 01/17/2007   HYPERTENSION 01/17/2007   Hypertension    LOW BACK PAIN 01/17/2007   NECK PAIN, RIGHT 05/09/2010   OBSTRUCTIVE SLEEP APNEA 01/17/2007   Sleep apnea    URI 05/09/2010   Past Surgical History:  Procedure Laterality Date   FOOT SURGERY     s/p bilateral knee arthroscopy  2011   Dr. Gladstone Lighter    reports that he has never smoked. He has never used smokeless tobacco. He reports that he does not drink alcohol and does not use drugs. family history includes Diabetes in an other family member. Allergies  Allergen Reactions   Flomax [Tamsulosin] Other (See Comments)    Itching all over    Current Outpatient Medications on File Prior to Visit  Medication Sig Dispense Refill   aspirin 81 MG tablet Take 81 mg by mouth daily.     meloxicam (MOBIC) 15 MG tablet Take 1 tablet (15 mg total) by mouth daily. 30 tablet 0   NON FORMULARY CPAP 13 advanced     sildenafil (VIAGRA) 100 MG tablet Take 0.5-1 tablets (50-100 mg total) by mouth daily as needed for erectile dysfunction. 5 tablet 11   No current facility-administered medications on file prior to visit.        ROS:  All others reviewed and negative.  Objective        PE:  BP (!) 170/92 (BP Location: Right Arm, Patient Position: Sitting, Cuff Size: Large)   Pulse 66   Temp 98.7 F (37.1 C) (Oral)   Ht '5\' 8"'$  (1.727 m)   Wt 276 lb (125.2 kg)   SpO2 99%   BMI 41.97 kg/m                 Constitutional: Pt appears in NAD               HENT: Head: NCAT.                Right Ear:  External ear normal.                 Left Ear: External ear normal.                Eyes: . Pupils are equal, round, and reactive to light. Conjunctivae and EOM are normal               Nose: without d/c or deformity               Neck: Neck supple. Gross normal ROM               Cardiovascular: Normal rate and regular rhythm.                 Pulmonary/Chest: Effort normal and breath sounds without rales or wheezing.                Abd:  Soft, NT, ND, + BS, no organomegaly               Neurological: Pt is alert. At baseline orientation, motor grossly intact               Skin: Skin is warm. No rashes, no other new lesions, LE edema - none               Psychiatric: Pt behavior is normal without agitation   Micro: none  Cardiac tracings I have personally interpreted today:  none  Pertinent Radiological findings (summarize): none   Lab Results  Component Value Date   WBC 10.3 07/29/2020   HGB 14.8 07/29/2020   HCT 43.7 07/29/2020   PLT 204.0 07/29/2020   GLUCOSE 93 09/22/2020   CHOL 135 07/29/2020   TRIG 87.0 07/29/2020   HDL 32.60  (L) 07/29/2020   LDLCALC 85 07/29/2020   ALT 19 07/29/2020   AST 19 07/29/2020   NA 141 09/22/2020   K 4.4 09/22/2020   CL 106 09/22/2020   CREATININE 1.38 09/22/2020   BUN 16 09/22/2020   CO2 29 09/22/2020   TSH 2.23 07/29/2020   PSA 1.45 07/29/2020   INR 1.18 07/14/2009   HGBA1C 5.7 07/29/2020   Assessment/Plan:  Joseph Choi is a 60 y.o. Black or African American [2] male with  has a past medical history of ANXIETY (01/17/2007), DEPRESSION (01/17/2007), GERD (01/17/2007), HYPERLIPIDEMIA (01/17/2007), HYPERTENSION (01/17/2007), Hypertension, LOW BACK PAIN (01/17/2007), NECK PAIN, RIGHT (05/09/2010), OBSTRUCTIVE SLEEP APNEA (01/17/2007), Sleep apnea, and URI (05/09/2010).  Vitamin D deficiency Last vitamin D Lab Results  Component Value Date   VD25OH 42.13 07/29/2020   Stable, cont oral replacement   Hypertension, uncontrolled BP Readings from Last 3 Encounters:  12/01/20 (!) 170/92  09/22/20 (!) 158/90  07/29/20 (!) 152/100   Severe uncontrolled, pt advised to restart the toprol xl, add hct 12.5 qd, continue benicar, and consider add amlodipine in 2 wks if not on average < 140/90   Hyperglycemia Lab Results  Component Value Date   HGBA1C 5.7 07/29/2020   Stable, pt to continue current medical treatment  - diet   BPH (benign prostatic hyperplasia) Ok for trial change flomax to rapaflo, consider urology if not improved  Followup: Return in about 6 months (around 05/31/2021).  Cathlean Cower, MD 12/06/2020 7:07 AM [facility]

## 2020-12-01 NOTE — Telephone Encounter (Signed)
I called the patient to let him know he can come to the office to pick up his note for work.

## 2020-12-02 ENCOUNTER — Encounter: Payer: Self-pay | Admitting: Podiatry

## 2020-12-06 ENCOUNTER — Encounter: Payer: Self-pay | Admitting: Internal Medicine

## 2020-12-06 DIAGNOSIS — N400 Enlarged prostate without lower urinary tract symptoms: Secondary | ICD-10-CM | POA: Insufficient documentation

## 2020-12-06 NOTE — Assessment & Plan Note (Signed)
Last vitamin D Lab Results  Component Value Date   VD25OH 42.13 07/29/2020   Stable, cont oral replacement

## 2020-12-06 NOTE — Assessment & Plan Note (Signed)
Ok for trial change flomax to rapaflo, consider urology if not improved

## 2020-12-06 NOTE — Telephone Encounter (Signed)
Called patient, no answer, left vmessage that his work note has been approved and that he can pick up at his convenience.

## 2020-12-06 NOTE — Assessment & Plan Note (Signed)
BP Readings from Last 3 Encounters:  12/01/20 (!) 170/92  09/22/20 (!) 158/90  07/29/20 (!) 152/100   Severe uncontrolled, pt advised to restart the toprol xl, add hct 12.5 qd, continue benicar, and consider add amlodipine in 2 wks if not on average < 140/90

## 2020-12-06 NOTE — Assessment & Plan Note (Signed)
Lab Results  Component Value Date   HGBA1C 5.7 07/29/2020   Stable, pt to continue current medical treatment  - diet

## 2020-12-23 ENCOUNTER — Encounter: Payer: Self-pay | Admitting: Internal Medicine

## 2020-12-23 ENCOUNTER — Ambulatory Visit: Payer: BC Managed Care – PPO | Admitting: Internal Medicine

## 2020-12-23 ENCOUNTER — Other Ambulatory Visit: Payer: Self-pay

## 2020-12-23 VITALS — BP 176/94 | HR 58 | Temp 98.0°F | Ht 68.0 in | Wt 282.0 lb

## 2020-12-23 DIAGNOSIS — I10 Essential (primary) hypertension: Secondary | ICD-10-CM

## 2020-12-23 DIAGNOSIS — E559 Vitamin D deficiency, unspecified: Secondary | ICD-10-CM

## 2020-12-23 DIAGNOSIS — N1831 Chronic kidney disease, stage 3a: Secondary | ICD-10-CM

## 2020-12-23 DIAGNOSIS — Z0000 Encounter for general adult medical examination without abnormal findings: Secondary | ICD-10-CM

## 2020-12-23 DIAGNOSIS — E7800 Pure hypercholesterolemia, unspecified: Secondary | ICD-10-CM | POA: Diagnosis not present

## 2020-12-23 DIAGNOSIS — N183 Chronic kidney disease, stage 3 unspecified: Secondary | ICD-10-CM | POA: Insufficient documentation

## 2020-12-23 DIAGNOSIS — E538 Deficiency of other specified B group vitamins: Secondary | ICD-10-CM

## 2020-12-23 DIAGNOSIS — R739 Hyperglycemia, unspecified: Secondary | ICD-10-CM | POA: Diagnosis not present

## 2020-12-23 MED ORDER — AMLODIPINE BESYLATE 10 MG PO TABS
10.0000 mg | ORAL_TABLET | Freq: Every day | ORAL | 3 refills | Status: DC
Start: 2020-12-23 — End: 2021-06-23

## 2020-12-23 NOTE — Progress Notes (Signed)
Patient ID: Joseph Choi, male   DOB: [birth year], 60 y.o.   MRN: [id number]        Chief Complaint: follow up HTN, HLD and hyperglycemia , ckd       HPI:  Joseph Choi is a 60 y.o. male here overall doing ok,  BP at least mild elevated at home in the 140/s.  Pt denies chest pain, increased sob or doe, wheezing, orthopnea, PND, increased LE swelling, palpitations, dizziness or syncope.   Pt denies polydipsia, polyuria, or new focal neuro s/s.   Pt denies fever, wt loss, night sweats, loss of appetite, or other constitutional symptoms  Excercises 5 times per wk at gym with wts, also does walking at the gym on sat/sun.   Pt denies fever, wt loss, night sweats, loss of appetite, or other constitutional symptoms  No new complaints       Wt Readings from Last 3 Encounters:  12/23/20 282 lb (127.9 kg)  12/01/20 276 lb (125.2 kg)  09/22/20 289 lb (131.1 kg)   BP Readings from Last 3 Encounters:  12/23/20 (!) 176/94  12/01/20 (!) 170/92  09/22/20 (!) 158/90         Past Medical History:  Diagnosis Date   ANXIETY 01/17/2007   DEPRESSION 01/17/2007   GERD 01/17/2007   HYPERLIPIDEMIA 01/17/2007   HYPERTENSION 01/17/2007   Hypertension    LOW BACK PAIN 01/17/2007   NECK PAIN, RIGHT 05/09/2010   OBSTRUCTIVE SLEEP APNEA 01/17/2007   Sleep apnea    URI 05/09/2010   Past Surgical History:  Procedure Laterality Date   FOOT SURGERY     s/p bilateral knee arthroscopy  2011   Dr. Gladstone Lighter    reports that he has never smoked. He has never used smokeless tobacco. He reports that he does not drink alcohol and does not use drugs. family history includes Diabetes in an other family member. Allergies  Allergen Reactions   Flomax [Tamsulosin] Other (See Comments)    Itching all over   Current Outpatient Medications on File Prior to Visit  Medication Sig Dispense Refill   aspirin 81 MG tablet Take 81 mg by mouth daily.     hydrochlorothiazide (MICROZIDE) 12.5 MG capsule Take 1 capsule (12.5 mg  total) by mouth daily. 90 capsule 3   meloxicam (MOBIC) 15 MG tablet Take 1 tablet (15 mg total) by mouth daily. 30 tablet 0   metoprolol succinate (TOPROL-XL) 50 MG 24 hr tablet Take 1 tablet (50 mg total) by mouth daily. Take with or immediately following a meal. 90 tablet 3   NON FORMULARY CPAP 13 advanced     olmesartan (BENICAR) 40 MG tablet Take 1 tablet (40 mg total) by mouth daily. 90 tablet 3   sildenafil (VIAGRA) 100 MG tablet Take 0.5-1 tablets (50-100 mg total) by mouth daily as needed for erectile dysfunction. 5 tablet 11   silodosin (RAPAFLO) 4 MG CAPS capsule Take 1 capsule (4 mg total) by mouth daily with breakfast. 90 capsule 3   No current facility-administered medications on file prior to visit.        ROS:  All others reviewed and negative.  Objective        PE:  BP (!) 176/94 (BP Location: Left Arm, Patient Position: Sitting, Cuff Size: Large)   Pulse (!) 58   Temp 98 F (36.7 C) (Oral)   Ht 5\' 8"  (1.727 m)   Wt 282 lb (127.9 kg)   SpO2 97%   BMI 42.88 kg/m  Constitutional: Pt appears in NAD               HENT: Head: NCAT.                Right Ear: External ear normal.                 Left Ear: External ear normal.                Eyes: . Pupils are equal, round, and reactive to light. Conjunctivae and EOM are normal               Nose: without d/c or deformity               Neck: Neck supple. Gross normal ROM               Cardiovascular: Normal rate and regular rhythm.                 Pulmonary/Chest: Effort normal and breath sounds without rales or wheezing.                Abd:  Soft, NT, ND, + BS, no organomegaly               Neurological: Pt is alert. At baseline orientation, motor grossly intact               Skin: Skin is warm. No rashes, no other new lesions, LE edema - none               Psychiatric: Pt behavior is normal without agitation   Micro: none  Cardiac tracings I have personally interpreted today:  none  Pertinent  Radiological findings (summarize): none   Lab Results  Component Value Date   WBC 10.3 07/29/2020   HGB 14.8 07/29/2020   HCT 43.7 07/29/2020   PLT 204.0 07/29/2020   GLUCOSE 93 09/22/2020   CHOL 135 07/29/2020   TRIG 87.0 07/29/2020   HDL 32.60 (L) 07/29/2020   LDLCALC 85 07/29/2020   ALT 19 07/29/2020   AST 19 07/29/2020   NA 141 09/22/2020   K 4.4 09/22/2020   CL 106 09/22/2020   CREATININE 1.38 09/22/2020   BUN 16 09/22/2020   CO2 29 09/22/2020   TSH 2.23 07/29/2020   PSA 1.45 07/29/2020   INR 1.18 07/14/2009   HGBA1C 5.7 07/29/2020   Assessment/Plan:  Joseph Choi is a 60 y.o. Black or African American [2] male with  has a past medical history of ANXIETY (01/17/2007), DEPRESSION (01/17/2007), GERD (01/17/2007), HYPERLIPIDEMIA (01/17/2007), HYPERTENSION (01/17/2007), Hypertension, LOW BACK PAIN (01/17/2007), NECK PAIN, RIGHT (05/09/2010), OBSTRUCTIVE SLEEP APNEA (01/17/2007), Sleep apnea, and URI (05/09/2010).  Hypertension, uncontrolled BP Readings from Last 3 Encounters:  12/23/20 (!) 176/94  12/01/20 (!) 170/92  09/22/20 (!) 158/90   Moderate to severe uncontrolled, suspect related to persistent obesity, for added amlodipine 10 qd,  pt to continue medical treatment  - hct, toprol, benicar   Hyperlipidemia Lab Results  Component Value Date   LDLCALC 85 07/29/2020   Uncontrolled, goal ldl < 70, pt to continue current low chol diet, declines statin for now   Vitamin D deficiency Last vitamin D Lab Results  Component Value Date   VD25OH 42.13 07/29/2020   Stable, cont oral replacement   CKD (chronic kidney disease) stage 3, GFR 30-59 ml/min (HCC) Lab Results  Component Value Date   CREATININE 1.38 09/22/2020   Stable overall, cont to avoid nephrotoxins  Hyperglycemia Lab Results  Component Value Date   HGBA1C 5.7 07/29/2020   Stable, pt to continue current medical treatment  - diet  Followup: Return in about 6 months (around  06/23/2021).  Cathlean Cower, MD 12/25/2020 8:59 PM [facility]

## 2020-12-23 NOTE — Patient Instructions (Signed)
Please take all new medication as prescribed - the amlodipine 10 mg per day  Please continue all other medications as before, and refills have been done if requested.  Please have the pharmacy call with any other refills you may need.  Please continue your efforts at being more active, low cholesterol diet, and weight control.  You are otherwise up to date with prevention measures today.  Please keep your appointments with your specialists as you may have planned  Please make an Appointment to return in 6 months, or sooner if needed, also with Labs done prior at the [facility]

## 2020-12-25 ENCOUNTER — Encounter: Payer: Self-pay | Admitting: Internal Medicine

## 2020-12-25 NOTE — Assessment & Plan Note (Signed)
Lab Results  Component Value Date   CREATININE 1.38 09/22/2020   Stable overall, cont to avoid nephrotoxins

## 2020-12-25 NOTE — Assessment & Plan Note (Signed)
Last vitamin D Lab Results  Component Value Date   VD25OH 42.13 07/29/2020   Stable, cont oral replacement

## 2020-12-25 NOTE — Assessment & Plan Note (Signed)
Lab Results  Component Value Date   LDLCALC 85 07/29/2020   Uncontrolled, goal ldl < 70, pt to continue current low chol diet, declines statin for now

## 2020-12-25 NOTE — Assessment & Plan Note (Signed)
Lab Results  Component Value Date   HGBA1C 5.7 07/29/2020   Stable, pt to continue current medical treatment  - diet

## 2020-12-25 NOTE — Assessment & Plan Note (Signed)
BP Readings from Last 3 Encounters:  12/23/20 (!) 176/94  12/01/20 (!) 170/92  09/22/20 (!) 158/90   Moderate to severe uncontrolled, suspect related to persistent obesity, for added amlodipine 10 qd,  pt to continue medical treatment  - hct, toprol, benicar

## 2021-03-22 ENCOUNTER — Ambulatory Visit: Payer: BC Managed Care – PPO | Admitting: Internal Medicine

## 2021-06-16 ENCOUNTER — Telehealth: Payer: Self-pay

## 2021-06-16 NOTE — Telephone Encounter (Signed)
? ?  Hypertension Outreach Note  ?Pharmacy Note ? ?Attempted to call patient to discuss HTN control since last PCP visit, no answer, left message for patient to return call  ? ?Tomasa Blase, PharmD ?Clinical Pharmacist, [facility]  ?

## 2021-06-21 ENCOUNTER — Other Ambulatory Visit (INDEPENDENT_AMBULATORY_CARE_PROVIDER_SITE_OTHER): Payer: BC Managed Care – PPO

## 2021-06-21 DIAGNOSIS — E538 Deficiency of other specified B group vitamins: Secondary | ICD-10-CM | POA: Diagnosis not present

## 2021-06-21 DIAGNOSIS — E7800 Pure hypercholesterolemia, unspecified: Secondary | ICD-10-CM | POA: Diagnosis not present

## 2021-06-21 DIAGNOSIS — Z0000 Encounter for general adult medical examination without abnormal findings: Secondary | ICD-10-CM

## 2021-06-21 DIAGNOSIS — Z125 Encounter for screening for malignant neoplasm of prostate: Secondary | ICD-10-CM | POA: Diagnosis not present

## 2021-06-21 DIAGNOSIS — E559 Vitamin D deficiency, unspecified: Secondary | ICD-10-CM

## 2021-06-21 DIAGNOSIS — R739 Hyperglycemia, unspecified: Secondary | ICD-10-CM | POA: Diagnosis not present

## 2021-06-21 LAB — HEPATIC FUNCTION PANEL
ALT: 19 U/L (ref 0–53)
AST: 23 U/L (ref 0–37)
Albumin: 4.5 g/dL (ref 3.5–5.2)
Alkaline Phosphatase: 87 U/L (ref 39–117)
Bilirubin, Direct: 0.1 mg/dL (ref 0.0–0.3)
Total Bilirubin: 0.8 mg/dL (ref 0.2–1.2)
Total Protein: 7.7 g/dL (ref 6.0–8.3)

## 2021-06-21 LAB — HEMOGLOBIN A1C: Hgb A1c MFr Bld: 5.8 % (ref 4.6–6.5)

## 2021-06-21 LAB — BASIC METABOLIC PANEL
BUN: 18 mg/dL (ref 6–23)
CO2: 23 mEq/L (ref 19–32)
Calcium: 9.7 mg/dL (ref 8.4–10.5)
Chloride: 105 mEq/L (ref 96–112)
Creatinine, Ser: 1.35 mg/dL (ref 0.40–1.50)
GFR: 56.11 mL/min — ABNORMAL LOW (ref >60.00)
Glucose, Bld: 91 mg/dL (ref 70–99)
Potassium: 4.1 mEq/L (ref 3.5–5.1)
Sodium: 140 mEq/L (ref 135–145)

## 2021-06-21 LAB — CBC WITH DIFFERENTIAL/PLATELET
Basophils Absolute: 0 10*3/uL (ref 0.0–0.1)
Basophils Relative: 0.7 % (ref 0.0–3.0)
Eosinophils Absolute: 0.1 10*3/uL (ref 0.0–0.7)
Eosinophils Relative: 1.2 % (ref 0.0–5.0)
HCT: 43.9 % (ref 39.0–52.0)
Hemoglobin: 14.5 g/dL (ref 13.0–17.0)
Lymphocytes Relative: 24.2 % (ref 12.0–46.0)
Lymphs Abs: 1.7 10*3/uL (ref 0.7–4.0)
MCHC: 33.1 g/dL (ref 30.0–36.0)
MCV: 89.6 fl (ref 78.0–100.0)
Monocytes Absolute: 0.5 10*3/uL (ref 0.1–1.0)
Monocytes Relative: 6.5 % (ref 3.0–12.0)
Neutro Abs: 4.8 10*3/uL (ref 1.4–7.7)
Neutrophils Relative %: 67.4 % (ref 43.0–77.0)
Platelets: 195 10*3/uL (ref 150.0–400.0)
RBC: 4.9 Mil/uL (ref 4.22–5.81)
RDW: 13.7 % (ref 11.5–15.5)
WBC: 7.1 10*3/uL (ref 4.0–10.5)

## 2021-06-21 LAB — URINALYSIS, ROUTINE W REFLEX MICROSCOPIC
Bilirubin Urine: NEGATIVE
Ketones, ur: NEGATIVE
Leukocytes,Ua: NEGATIVE
Nitrite: NEGATIVE
Specific Gravity, Urine: 1.02 (ref 1.000–1.030)
Total Protein, Urine: 30 — AB
Urine Glucose: NEGATIVE
Urobilinogen, UA: 0.2 (ref 0.0–1.0)
pH: 6 (ref 5.0–8.0)

## 2021-06-21 LAB — TSH: TSH: 2.71 u[IU]/mL (ref 0.35–5.50)

## 2021-06-21 LAB — LIPID PANEL
Cholesterol: 155 mg/dL (ref 0–200)
HDL: 38.1 mg/dL — ABNORMAL LOW (ref >39.00)
LDL Cholesterol: 95 mg/dL (ref 0–99)
NonHDL: 116.55
Total CHOL/HDL Ratio: 4
Triglycerides: 110 mg/dL (ref 0.0–149.0)
VLDL: 22 mg/dL (ref 0.0–40.0)

## 2021-06-21 LAB — PSA: PSA: 1.2 ng/mL (ref 0.10–4.00)

## 2021-06-21 LAB — VITAMIN B12: Vitamin B-12: 377 pg/mL (ref 211–911)

## 2021-06-21 LAB — VITAMIN D 25 HYDROXY (VIT D DEFICIENCY, FRACTURES): VITD: 27.61 ng/mL — ABNORMAL LOW (ref 30.00–100.00)

## 2021-06-23 ENCOUNTER — Ambulatory Visit: Payer: BC Managed Care – PPO | Admitting: Internal Medicine

## 2021-06-23 ENCOUNTER — Encounter: Payer: Self-pay | Admitting: Internal Medicine

## 2021-06-23 VITALS — BP 130/80 | HR 68 | Temp 98.4°F | Ht 68.0 in | Wt 291.0 lb

## 2021-06-23 DIAGNOSIS — E7800 Pure hypercholesterolemia, unspecified: Secondary | ICD-10-CM | POA: Diagnosis not present

## 2021-06-23 DIAGNOSIS — R9431 Abnormal electrocardiogram [ECG] [EKG]: Secondary | ICD-10-CM

## 2021-06-23 DIAGNOSIS — E559 Vitamin D deficiency, unspecified: Secondary | ICD-10-CM | POA: Diagnosis not present

## 2021-06-23 DIAGNOSIS — I10 Essential (primary) hypertension: Secondary | ICD-10-CM

## 2021-06-23 DIAGNOSIS — R739 Hyperglycemia, unspecified: Secondary | ICD-10-CM | POA: Diagnosis not present

## 2021-06-23 DIAGNOSIS — N1831 Chronic kidney disease, stage 3a: Secondary | ICD-10-CM

## 2021-06-23 DIAGNOSIS — Z0001 Encounter for general adult medical examination with abnormal findings: Secondary | ICD-10-CM | POA: Diagnosis not present

## 2021-06-23 MED ORDER — METOPROLOL SUCCINATE ER 50 MG PO TB24: 50.0000 mg | ORAL_TABLET | Freq: Every day | ORAL | 3 refills | Status: DC

## 2021-06-23 MED ORDER — HYDROCHLOROTHIAZIDE 12.5 MG PO CAPS: 12.5000 mg | ORAL_CAPSULE | Freq: Every day | ORAL | 3 refills | Status: DC

## 2021-06-23 MED ORDER — ROSUVASTATIN CALCIUM 10 MG PO TABS: 10.0000 mg | ORAL_TABLET | Freq: Every day | ORAL | 3 refills | Status: DC

## 2021-06-23 MED ORDER — SILODOSIN 4 MG PO CAPS: 4.0000 mg | ORAL_CAPSULE | Freq: Every day | ORAL | 3 refills | Status: DC

## 2021-06-23 MED ORDER — AMLODIPINE BESYLATE 10 MG PO TABS: 10.0000 mg | ORAL_TABLET | Freq: Every day | ORAL | 3 refills | Status: DC

## 2021-06-23 MED ORDER — CHOLECALCIFEROL 50 MCG (2000 UT) PO TABS: ORAL_TABLET | ORAL | 99 refills | Status: AC

## 2021-06-23 MED ORDER — OLMESARTAN MEDOXOMIL 40 MG PO TABS: 40.0000 mg | ORAL_TABLET | Freq: Every day | ORAL | 3 refills | Status: DC

## 2021-06-23 MED ORDER — TADALAFIL 20 MG PO TABS: 10.0000 mg | ORAL_TABLET | ORAL | 11 refills | Status: DC | PRN

## 2021-06-23 NOTE — Assessment & Plan Note (Addendum)
Last vitamin D Lab Results  Component Value Date   VD25OH 27.61 (L) 06/21/2021   Low, to start oral replacement  

## 2021-06-23 NOTE — Patient Instructions (Addendum)
Please take all new medication as prescribed - the cialis as needed ? ?Please take OTC Vitamin D3 at 2000 units per day, indefinitely ? ?Please take all new medication as prescribed - the generic crestor 10 mg per day ? ?We have discussed the Cardiac CT Score test to measure the calcification level (if any) in your heart arteries.  This test has been ordered in our [facility], so please call [facility] directly, as they prefer this, at [phone] to be scheduled. ? ?Please continue all other medications as before, and refills have been done if requested. ? ?Please have the pharmacy call with any other refills you may need. ? ?Please continue your efforts at being more active, low cholesterol diet, and weight control. ? ?You are otherwise up to date with prevention measures today. ? ?Please keep your appointments with your specialists as you may have planned ? ?Please make an Appointment to return in 6 months, or sooner if needed, also with Lab Appointment for testing done 3-5 days before at the [facility] (so this is for TWO appointments - please see the scheduling desk as you leave) ? ?Due to the ongoing Covid 19 pandemic, our lab now requires an appointment for any labs done at our office.  If you need labs done and do not have an appointment, please call our office ahead of time to schedule before presenting to the lab for your testing. ? ? ? ? ?

## 2021-06-23 NOTE — Progress Notes (Signed)
Patient ID: Joseph Choi, male   DOB: 1960-03-26, 61 y.o.   MRN: [id number] ? ? ? ?     Chief Complaint:: wellness exam and low vit d, hld, ED, ckd, hyperglycemia, htn ? ?     HPI:  Joseph Choi is a 61 y.o. male here for wellness exam; declines covid booster, shingrix, o/w up to date ?         ?              Also not taking vit d.  Asks to try cialis as viagra not working well.  Trying to follow lower chol diet.  Pt denies chest pain, increased sob or doe, wheezing, orthopnea, PND, increased LE swelling, palpitations, dizziness or syncope.   Pt denies polydipsia, polyuria, or new focal neuro s/s.    Pt denies fever, wt loss, night sweats, loss of appetite, or other constitutional symptoms   ?  ?Wt Readings from Last 3 Encounters:  ?06/23/21 291 lb (132 kg)  ?12/23/20 282 lb (127.9 kg)  ?12/01/20 276 lb (125.2 kg)  ? ?BP Readings from Last 3 Encounters:  ?06/23/21 130/80  ?12/23/20 (!) 176/94  ?12/01/20 (!) 170/92  ? ?Immunization History  ?Administered Date(s) Administered  ? Influenza Whole 02/20/2007  ? Influenza,inj,Quad PF,6+ Mos 12/09/2013, 12/07/2016, 02/08/2018, 12/19/2018, 12/01/2020  ? PFIZER(Purple Top)SARS-COV-2 Vaccination 05/28/2019, 06/28/2019, 12/29/2019  ? Td 03/04/2008  ? Tdap 09/27/2018  ? ?There are no preventive care reminders to display for this patient. ? ?  ? ?Past Medical History:  ?Diagnosis Date  ? ANXIETY 01/17/2007  ? DEPRESSION 01/17/2007  ? GERD 01/17/2007  ? HYPERLIPIDEMIA 01/17/2007  ? HYPERTENSION 01/17/2007  ? Hypertension   ? LOW BACK PAIN 01/17/2007  ? NECK PAIN, RIGHT 05/09/2010  ? OBSTRUCTIVE SLEEP APNEA 01/17/2007  ? Sleep apnea   ? URI 05/09/2010  ? ?Past Surgical History:  ?Procedure Laterality Date  ? FOOT SURGERY    ? s/p bilateral knee arthroscopy  2011  ? Dr. Gladstone Lighter  ? ? reports that he has never smoked. He has never used smokeless tobacco. He reports that he does not drink alcohol and does not use drugs. ?family history includes Diabetes in an other family  member. ?Allergies  ?Allergen Reactions  ? Flomax [Tamsulosin] Other (See Comments)  ?  Itching all over  ? ?Current Outpatient Medications on File Prior to Visit  ?Medication Sig Dispense Refill  ? aspirin 81 MG tablet Take 81 mg by mouth daily.    ? NON FORMULARY CPAP 13 advanced    ? sildenafil (VIAGRA) 100 MG tablet Take 0.5-1 tablets (50-100 mg total) by mouth daily as needed for erectile dysfunction. 5 tablet 11  ? ?No current facility-administered medications on file prior to visit.  ? ?     ROS:  All others reviewed and negative. ? ?Objective  ? ?     PE:  BP 130/80 (BP Location: Right Arm, Patient Position: Sitting, Cuff Size: Large)   Pulse 68   Temp 98.4 ?F (36.9 ?C) (Oral)   Ht '5\' 8"'$  (1.727 m)   Wt 291 lb (132 kg)   SpO2 99%   BMI 44.25 kg/m?  ? ?              Constitutional: Pt appears in NAD ?              HENT: Head: NCAT.  ?              Right Ear: External  ear normal.   ?              Left Ear: External ear normal.  ?              Eyes: . Pupils are equal, round, and reactive to light. Conjunctivae and EOM are normal ?              Nose: without d/c or deformity ?              Neck: Neck supple. Gross normal ROM ?              Cardiovascular: Normal rate and regular rhythm.   ?              Pulmonary/Chest: Effort normal and breath sounds without rales or wheezing.  ?              Abd:  Soft, NT, ND, + BS, no organomegaly ?              Neurological: Pt is alert. At baseline orientation, motor grossly intact ?              Skin: Skin is warm. No rashes, no other new lesions, LE edema - none ?              Psychiatric: Pt behavior is normal without agitation  ? ?Micro: none ? ?Cardiac tracings I have personally interpreted today:  none ? ?Pertinent Radiological findings (summarize): none  ? ?Lab Results  ?Component Value Date  ? WBC 7.1 06/21/2021  ? HGB 14.5 06/21/2021  ? HCT 43.9 06/21/2021  ? PLT 195.0 06/21/2021  ? GLUCOSE 91 06/21/2021  ? CHOL 155 06/21/2021  ? TRIG 110.0 06/21/2021  ? HDL  38.10 (L) 06/21/2021  ? [place] 95 06/21/2021  ? ALT 19 06/21/2021  ? AST 23 06/21/2021  ? NA 140 06/21/2021  ? K 4.1 06/21/2021  ? CL 105 06/21/2021  ? CREATININE 1.35 06/21/2021  ? BUN 18 06/21/2021  ? CO2 23 06/21/2021  ? TSH 2.71 06/21/2021  ? PSA 1.20 06/21/2021  ? INR 1.18 07/14/2009  ? HGBA1C 5.8 06/21/2021  ? ?Assessment/Plan:  ?Joseph Choi is a 61 y.o. Black or African American [2] male with  has a past medical history of ANXIETY (01/17/2007), DEPRESSION (01/17/2007), GERD (01/17/2007), HYPERLIPIDEMIA (01/17/2007), HYPERTENSION (01/17/2007), Hypertension, LOW BACK PAIN (01/17/2007), NECK PAIN, RIGHT (05/09/2010), OBSTRUCTIVE SLEEP APNEA (01/17/2007), Sleep apnea, and URI (05/09/2010). ? ?Vitamin D deficiency ?Last vitamin D ?Lab Results  ?Component Value Date  ? VD25OH 27.61 (L) 06/21/2021  ? ?Low, to start oral replacement ? ? ?Encounter for well adult exam with abnormal findings ?Age and sex appropriate education and counseling updated with regular exercise and diet ?Referrals for preventative services - none needed ?Immunizations addressed - decliens covid booster, shingrix ?Smoking counseling  - none needed ?Evidence for depression or other mood disorder - none significant ?Most recent labs reviewed. ?I have personally reviewed and have noted: ?1) the patient's medical and social history ?2) The patient's current medications and supplements ?3) The patient's height, weight, and BMI have been recorded in the chart ? ? ?CKD (chronic kidney disease) stage 3, GFR 30-59 ml/min (HCC) ?Lab Results  ?Component Value Date  ? CREATININE 1.35 06/21/2021  ? ?Stable overall, cont to avoid nephrotoxins ? ? ?Hyperglycemia ?Lab Results  ?Component Value Date  ? HGBA1C 5.8 06/21/2021  ? ?Stable, pt to continue current medical treatment  - diet ? ? ?Hyperlipidemia ?  Lab Results  ?Component Value Date  ? [place] 95 06/21/2021  ? ?Uncontrolled, goal ldl < 70,, pt to start crestor 10, lower chol diet, also for cardiac CT  score ? ? ?Hypertension, uncontrolled ?BP Readings from Last 3 Encounters:  ?06/23/21 130/80  ?12/23/20 (!) 176/94  ?12/01/20 (!) 170/92  ? ?Stable, pt to continue medical treatment amlodipine, hct, toprol, benicar ? ?Followup: Return in about 6 months (around 12/23/2021). ? ?Cathlean Cower, MD 06/25/2021 2:30 PM ?[place] [facility] ?Internal Medicine ?

## 2021-06-25 ENCOUNTER — Encounter: Payer: Self-pay | Admitting: Internal Medicine

## 2021-06-25 NOTE — Assessment & Plan Note (Signed)
Lab Results  ?Component Value Date  ? HGBA1C 5.8 06/21/2021  ? ?Stable, pt to continue current medical treatment  - diet ? ?

## 2021-06-25 NOTE — Assessment & Plan Note (Signed)
BP Readings from Last 3 Encounters:  ?06/23/21 130/80  ?12/23/20 (!) 176/94  ?12/01/20 (!) 170/92  ? ?Stable, pt to continue medical treatment amlodipine, hct, toprol, benicar ? ?

## 2021-06-25 NOTE — Assessment & Plan Note (Signed)
Age and sex appropriate education and counseling updated with regular exercise and diet Referrals for preventative services - none needed Immunizations addressed - decliens covid booster, shingrix Smoking counseling  - none needed Evidence for depression or other mood disorder - none significant Most recent labs reviewed. I have personally reviewed and have noted: 1) the patient's medical and social history 2) The patient's current medications and supplements 3) The patient's height, weight, and BMI have been recorded in the chart  

## 2021-06-25 NOTE — Assessment & Plan Note (Signed)
Lab Results  Component Value Date   CREATININE 1.35 06/21/2021   Stable overall, cont to avoid nephrotoxins  

## 2021-06-25 NOTE — Assessment & Plan Note (Addendum)
Lab Results  ?Component Value Date  ? [place] 95 06/21/2021  ? ?Uncontrolled, goal ldl < 70,, pt to start crestor 10, lower chol diet, also for cardiac CT score ? ?

## 2021-08-18 ENCOUNTER — Encounter: Payer: Self-pay | Admitting: Internal Medicine

## 2021-08-19 MED ORDER — SILDENAFIL CITRATE 100 MG PO TABS: 50.0000 mg | ORAL_TABLET | Freq: Every day | ORAL | 11 refills | Status: DC | PRN

## 2021-08-29 ENCOUNTER — Encounter: Payer: Self-pay | Admitting: Internal Medicine

## 2021-08-30 MED ORDER — SILDENAFIL CITRATE 20 MG PO TABS: ORAL_TABLET | ORAL | 2 refills | Status: DC

## 2021-10-31 ENCOUNTER — Encounter: Payer: Self-pay | Admitting: Internal Medicine

## 2021-10-31 DIAGNOSIS — N401 Enlarged prostate with lower urinary tract symptoms: Secondary | ICD-10-CM

## 2021-10-31 DIAGNOSIS — N529 Male erectile dysfunction, unspecified: Secondary | ICD-10-CM

## 2022-01-24 ENCOUNTER — Ambulatory Visit: Payer: BC Managed Care – PPO | Admitting: Internal Medicine

## 2022-01-24 ENCOUNTER — Encounter: Payer: Self-pay | Admitting: Internal Medicine

## 2022-01-24 VITALS — BP 148/92 | HR 63 | Temp 98.1°F | Ht 68.0 in | Wt 286.0 lb

## 2022-01-24 DIAGNOSIS — I10 Essential (primary) hypertension: Secondary | ICD-10-CM

## 2022-01-24 DIAGNOSIS — Z23 Encounter for immunization: Secondary | ICD-10-CM

## 2022-01-24 DIAGNOSIS — G4733 Obstructive sleep apnea (adult) (pediatric): Secondary | ICD-10-CM

## 2022-01-24 DIAGNOSIS — E7800 Pure hypercholesterolemia, unspecified: Secondary | ICD-10-CM | POA: Diagnosis not present

## 2022-01-24 DIAGNOSIS — R739 Hyperglycemia, unspecified: Secondary | ICD-10-CM

## 2022-01-24 DIAGNOSIS — N1831 Chronic kidney disease, stage 3a: Secondary | ICD-10-CM

## 2022-01-24 DIAGNOSIS — E559 Vitamin D deficiency, unspecified: Secondary | ICD-10-CM

## 2022-01-24 LAB — BASIC METABOLIC PANEL
BUN: 12 mg/dL (ref 6–23)
CO2: 30 mEq/L (ref 19–32)
Calcium: 9.6 mg/dL (ref 8.4–10.5)
Chloride: 104 mEq/L (ref 96–112)
Creatinine, Ser: 1.17 mg/dL (ref 0.40–1.50)
GFR: 66.34 mL/min (ref >60.00)
Glucose, Bld: 88 mg/dL (ref 70–99)
Potassium: 4.3 mEq/L (ref 3.5–5.1)
Sodium: 139 mEq/L (ref 135–145)

## 2022-01-24 LAB — VITAMIN D 25 HYDROXY (VIT D DEFICIENCY, FRACTURES): VITD: 33.37 ng/mL (ref 30.00–100.00)

## 2022-01-24 LAB — HEPATIC FUNCTION PANEL
ALT: 26 U/L (ref 0–53)
AST: 26 U/L (ref 0–37)
Albumin: 4.4 g/dL (ref 3.5–5.2)
Alkaline Phosphatase: 93 U/L (ref 39–117)
Bilirubin, Direct: 0.2 mg/dL (ref 0.0–0.3)
Total Bilirubin: 0.7 mg/dL (ref 0.2–1.2)
Total Protein: 7.8 g/dL (ref 6.0–8.3)

## 2022-01-24 LAB — LIPID PANEL
Cholesterol: 96 mg/dL (ref 0–200)
HDL: 34.3 mg/dL — ABNORMAL LOW (ref >39.00)
LDL Cholesterol: 41 mg/dL (ref 0–99)
NonHDL: 61.54
Total CHOL/HDL Ratio: 3
Triglycerides: 105 mg/dL (ref 0.0–149.0)
VLDL: 21 mg/dL (ref 0.0–40.0)

## 2022-01-24 LAB — HEMOGLOBIN A1C: Hgb A1c MFr Bld: 5.7 % (ref 4.6–6.5)

## 2022-01-24 MED ORDER — METOPROLOL SUCCINATE ER 100 MG PO TB24: 100.0000 mg | ORAL_TABLET | Freq: Every day | ORAL | 3 refills | Status: DC

## 2022-01-24 NOTE — Assessment & Plan Note (Signed)
Due for f/u, needs new equipment,  refer Pulmonary, to f/u any worsening symptoms or concerns,

## 2022-01-24 NOTE — Progress Notes (Signed)
Chief Complaint: follow up HTN, HLD and hyperglycemia , low vit d, ckd 3a       HPI:  Joseph Choi is a 61 y.o. male here overall doing ok, Pt denies chest pain, increased sob or doe, wheezing, orthopnea, PND, increased LE swelling, palpitations, dizziness or syncope.   Pt denies polydipsia, polyuria, or new focal neuro s/s.    Pt denies fever, wt loss, night sweats, loss of appetite, or other constitutional symptoms  BP has been persistently elevated at home as well .  Lost wt everal lbs with better diet and exercise, but BP remains elevated.  Peak wt has been 298 just a few wks ago.  Does have hx of OSA with very good compliance, but machine 61 yrs old.   Tolerating crestor well.  Due for flu shot Wt Readings from Last 3 Encounters:  01/24/22 286 lb (129.7 kg)  06/23/21 291 lb (132 kg)  12/23/20 282 lb (127.9 kg)   BP Readings from Last 3 Encounters:  01/24/22 (!) 148/92  06/23/21 130/80  12/23/20 (!) 176/94         Past Medical History:  Diagnosis Date   ANXIETY 01/17/2007   DEPRESSION 01/17/2007   GERD 01/17/2007   HYPERLIPIDEMIA 01/17/2007   HYPERTENSION 01/17/2007   Hypertension    LOW BACK PAIN 01/17/2007   NECK PAIN, RIGHT 05/09/2010   OBSTRUCTIVE SLEEP APNEA 01/17/2007   Sleep apnea    URI 05/09/2010   Past Surgical History:  Procedure Laterality Date   FOOT SURGERY     s/p bilateral knee arthroscopy  2011   Dr. Gladstone Lighter    reports that he has never smoked. He has never used smokeless tobacco. He reports that he does not drink alcohol and does not use drugs. family history includes Diabetes in an other family member. Allergies  Allergen Reactions   Flomax [Tamsulosin] Other (See Comments)    Itching all over   Current Outpatient Medications on File Prior to Visit  Medication Sig Dispense Refill   amLODipine (NORVASC) 10 MG tablet Take 1 tablet (10 mg total) by mouth daily. 90 tablet 3   aspirin 81 MG tablet Take 81 mg by mouth daily.     Cholecalciferol  50 MCG (2000 UT) TABS 1 tab by mouth once daily 30 tablet 99   hydrochlorothiazide (MICROZIDE) 12.5 MG capsule Take 1 capsule (12.5 mg total) by mouth daily. 90 capsule 3   NON FORMULARY CPAP 13 advanced     olmesartan (BENICAR) 40 MG tablet Take 1 tablet (40 mg total) by mouth daily. 90 tablet 3   rosuvastatin (CRESTOR) 10 MG tablet Take 1 tablet (10 mg total) by mouth daily. 90 tablet 3   sildenafil (REVATIO) 20 MG tablet Take 3-5 tablets once daily as needed 150 tablet 2   silodosin (RAPAFLO) 4 MG CAPS capsule Take 1 capsule (4 mg total) by mouth daily with breakfast. 90 capsule 3   No current facility-administered medications on file prior to visit.        ROS:  All others reviewed and negative.  Objective        PE:  BP (!) 148/92 (BP Location: Left Arm, Patient Position: Sitting, Cuff Size: Large)   Pulse 63   Temp 98.1 F (36.7 C) (Oral)   Ht '5\' 8"'$  (1.727 m)   Wt 286 lb (129.7 kg)   SpO2 97%   BMI 43.49 kg/m  Constitutional: Pt appears in NAD               HENT: Head: NCAT.                Right Ear: External ear normal.                 Left Ear: External ear normal.                Eyes: . Pupils are equal, round, and reactive to light. Conjunctivae and EOM are normal               Nose: without d/c or deformity               Neck: Neck supple. Gross normal ROM               Cardiovascular: Normal rate and regular rhythm.                 Pulmonary/Chest: Effort normal and breath sounds without rales or wheezing.                Abd:  Soft, NT, ND, + BS, no organomegaly               Neurological: Pt is alert. At baseline orientation, motor grossly intact               Skin: Skin is warm. No rashes, no other new lesions, LE edema - none               Psychiatric: Pt behavior is normal without agitation   Micro: none  Cardiac tracings I have personally interpreted today:  none  Pertinent Radiological findings (summarize): none   Lab Results  Component  Value Date   WBC 7.1 06/21/2021   HGB 14.5 06/21/2021   HCT 43.9 06/21/2021   PLT 195.0 06/21/2021   GLUCOSE 91 06/21/2021   CHOL 155 06/21/2021   TRIG 110.0 06/21/2021   HDL 38.10 (L) 06/21/2021   LDLCALC 95 06/21/2021   ALT 19 06/21/2021   AST 23 06/21/2021   NA 140 06/21/2021   K 4.1 06/21/2021   CL 105 06/21/2021   CREATININE 1.35 06/21/2021   BUN 18 06/21/2021   CO2 23 06/21/2021   TSH 2.71 06/21/2021   PSA 1.20 06/21/2021   INR 1.18 07/14/2009   HGBA1C 5.7 01/24/2022   Assessment/Plan:  Joseph Choi is a 61 y.o. Black or African American [2] male with  has a past medical history of ANXIETY (01/17/2007), DEPRESSION (01/17/2007), GERD (01/17/2007), HYPERLIPIDEMIA (01/17/2007), HYPERTENSION (01/17/2007), Hypertension, LOW BACK PAIN (01/17/2007), NECK PAIN, RIGHT (05/09/2010), OBSTRUCTIVE SLEEP APNEA (01/17/2007), Sleep apnea, and URI (05/09/2010).  OBSTRUCTIVE SLEEP APNEA Due for f/u, needs new equipment,  refer Pulmonary, to f/u any worsening symptoms or concerns,   Vitamin D deficiency Last vitamin D Lab Results  Component Value Date   VD25OH 27.61 (L) 06/21/2021   Low, to start oral replacement   Hypertension, uncontrolled BP Readings from Last 3 Encounters:  01/24/22 (!) 148/92  06/23/21 130/80  12/23/20 (!) 176/94   Unocntrolled, , pt to continue medical treatment norvasc 10 qd, hct 12.5 qd, benicar 40 mg qd, but increase toprol xl to 100 mg qd   Hyperlipidemia Lab Results  Component Value Date   LDLCALC 95 06/21/2021   Uncontrolled now on crestor 10 mg qd, for f/u labs today   Hyperglycemia Lab Results  Component Value Date   HGBA1C 5.7 01/24/2022  Stable, pt to continue current medical treatment  - diet, wt control, exercise, also refer DM education   CKD (chronic kidney disease) stage 3, GFR 30-59 ml/min (HCC) Lab Results  Component Value Date   CREATININE 1.35 06/21/2021   Stable overall, cont to avoid nephrotoxins  Followup: Return  in about 6 months (around 07/25/2022).  Cathlean Cower, MD 01/24/2022 12:20 PM [facility]

## 2022-01-24 NOTE — Assessment & Plan Note (Signed)
Last vitamin D Lab Results  Component Value Date   VD25OH 27.61 (L) 06/21/2021   Low, to start oral replacement

## 2022-01-24 NOTE — Assessment & Plan Note (Signed)
Lab Results  Component Value Date   CREATININE 1.35 06/21/2021   Stable overall, cont to avoid nephrotoxins

## 2022-01-24 NOTE — Assessment & Plan Note (Addendum)
Lab Results  Component Value Date   HGBA1C 5.7 01/24/2022   Stable, pt to continue current medical treatment  - diet, wt control, exercise, also refer DM education

## 2022-01-24 NOTE — Patient Instructions (Addendum)
You will be contacted regarding the referral for: dietary education  Please have your Shingrix (shingles) shots done at your local pharmacy.  You had the flu shot today  Ok to increase the Toprol xl to 100 mg per day  Please continue all other medications as before, and refills have been done if requested.  Please have the pharmacy call with any other refills you may need.  Please continue your efforts at being more active, low cholesterol diet, and weight control..  Please keep your appointments with your specialists as you may have planned  You will be contacted regarding the referral for: pulmonary for sleep apnea  Please go to the LAB at the blood drawing area for the tests to be done  You will be contacted by phone if any changes need to be made immediately.  Otherwise, you will receive a letter about your results with an explanation, but please check with MyChart first.  Please remember to sign up for MyChart if you have not done so, as this will be important to you in the future with finding out test results, communicating by private email, and scheduling acute appointments online when needed.  Please make an Appointment to return in 6 months, or sooner if needed

## 2022-01-24 NOTE — Assessment & Plan Note (Signed)
BP Readings from Last 3 Encounters:  01/24/22 (!) 148/92  06/23/21 130/80  12/23/20 (!) 176/94   Unocntrolled, , pt to continue medical treatment norvasc 10 qd, hct 12.5 qd, benicar 40 mg qd, but increase toprol xl to 100 mg qd

## 2022-01-24 NOTE — Assessment & Plan Note (Signed)
Lab Results  Component Value Date   [place] 95 06/21/2021   Uncontrolled now on crestor 10 mg qd, for f/u labs today

## 2022-03-22 ENCOUNTER — Other Ambulatory Visit: Payer: Self-pay

## 2022-03-22 MED ORDER — OLMESARTAN MEDOXOMIL 40 MG PO TABS: 40.0000 mg | ORAL_TABLET | Freq: Every day | ORAL | 2 refills | Status: DC

## 2022-03-24 ENCOUNTER — Other Ambulatory Visit: Payer: Self-pay

## 2022-03-24 MED ORDER — SILDENAFIL CITRATE 20 MG PO TABS: ORAL_TABLET | ORAL | 1 refills | Status: DC

## 2022-05-25 ENCOUNTER — Ambulatory Visit: Payer: BC Managed Care – PPO | Admitting: Podiatry

## 2022-05-25 ENCOUNTER — Ambulatory Visit (INDEPENDENT_AMBULATORY_CARE_PROVIDER_SITE_OTHER): Payer: BC Managed Care – PPO

## 2022-05-25 DIAGNOSIS — M205X2 Other deformities of toe(s) (acquired), left foot: Secondary | ICD-10-CM

## 2022-05-25 DIAGNOSIS — M79671 Pain in right foot: Secondary | ICD-10-CM | POA: Diagnosis not present

## 2022-05-25 DIAGNOSIS — M205X1 Other deformities of toe(s) (acquired), right foot: Secondary | ICD-10-CM

## 2022-05-25 DIAGNOSIS — M7751 Other enthesopathy of right foot: Secondary | ICD-10-CM | POA: Diagnosis not present

## 2022-05-25 NOTE — Progress Notes (Signed)
Subjective: Chief Complaint  Patient presents with   Foot Pain    Patient had an injury falling down the stairs 2 years ago. Pain has been constant but recently has become worse, pain located in the great hallux     62 year old male presents the office today with above concerns.  He was walking 3 miles a day but not as much now having his pain is even using a recumbent bike.  He has no recent injuries.  He has management.  No Other Concerns.  Objective: AAO x3, NAD DP/PT pulses palpable bilaterally, CRT less than 3 seconds Tenderness upon palpation and with range of motion of the first MPJ.  There are some slight edema but no erythema or warmth.  There is restriction of motion on the first MPJ with crepitation.  There is no other areas of discomfort noted today.  MMT 5/5. No pain with calf compression, swelling, warmth, erythema  Assessment: 62 year old male with capsulitis, hallux rigidus right foot  Plan: -All treatment options discussed with the patient including all alternatives, risks, complications.  -We discussed steroid injection today but ultimately decided to hold off on this.  Discussed different anti-inflammatories and topical creams he can use but ultimately we discussed the issue with the arthritis.  Discussed with conservative as well as surgical options.  Will restart orthotics with a Morton's extension to see if this helps.  If no improvement consider first MPJ arthrodesis. -Patient encouraged to call the office with any questions, concerns, change in symptoms.   Trula Slade DPM

## 2022-05-25 NOTE — Patient Instructions (Signed)
You can use VOLTAREN GEL sparingly to use for pain on the big toe joint

## 2022-06-28 ENCOUNTER — Ambulatory Visit (INDEPENDENT_AMBULATORY_CARE_PROVIDER_SITE_OTHER): Payer: BC Managed Care – PPO | Admitting: Podiatry

## 2022-06-28 DIAGNOSIS — M7751 Other enthesopathy of right foot: Secondary | ICD-10-CM

## 2022-06-28 DIAGNOSIS — M205X1 Other deformities of toe(s) (acquired), right foot: Secondary | ICD-10-CM

## 2022-06-28 DIAGNOSIS — M79671 Pain in right foot: Secondary | ICD-10-CM

## 2022-06-28 NOTE — Progress Notes (Signed)
Patient presents today to pick up custom molded foot orthotics recommended by Dr. WAGONER.   Orthotics were dispensed and fit was satisfactory. Reviewed instructions for break-in and wear. Written instructions given to patient.  Patient will follow up as needed.     

## 2022-07-25 ENCOUNTER — Encounter: Payer: Self-pay | Admitting: Internal Medicine

## 2022-07-25 ENCOUNTER — Ambulatory Visit: Payer: BC Managed Care – PPO | Admitting: Internal Medicine

## 2022-07-25 VITALS — BP 132/78 | HR 62 | Temp 98.5°F | Ht 68.0 in | Wt 302.0 lb

## 2022-07-25 DIAGNOSIS — R739 Hyperglycemia, unspecified: Secondary | ICD-10-CM

## 2022-07-25 DIAGNOSIS — E559 Vitamin D deficiency, unspecified: Secondary | ICD-10-CM

## 2022-07-25 DIAGNOSIS — Z86010 Personal history of colonic polyps: Secondary | ICD-10-CM

## 2022-07-25 DIAGNOSIS — Z125 Encounter for screening for malignant neoplasm of prostate: Secondary | ICD-10-CM | POA: Diagnosis not present

## 2022-07-25 DIAGNOSIS — E538 Deficiency of other specified B group vitamins: Secondary | ICD-10-CM | POA: Diagnosis not present

## 2022-07-25 DIAGNOSIS — N1831 Chronic kidney disease, stage 3a: Secondary | ICD-10-CM | POA: Diagnosis not present

## 2022-07-25 DIAGNOSIS — K921 Melena: Secondary | ICD-10-CM | POA: Diagnosis not present

## 2022-07-25 DIAGNOSIS — E7800 Pure hypercholesterolemia, unspecified: Secondary | ICD-10-CM | POA: Diagnosis not present

## 2022-07-25 DIAGNOSIS — Z0001 Encounter for general adult medical examination with abnormal findings: Secondary | ICD-10-CM

## 2022-07-25 DIAGNOSIS — I10 Essential (primary) hypertension: Secondary | ICD-10-CM

## 2022-07-25 LAB — URINALYSIS, ROUTINE W REFLEX MICROSCOPIC
Bilirubin Urine: NEGATIVE
Hgb urine dipstick: NEGATIVE
Ketones, ur: NEGATIVE
Leukocytes,Ua: NEGATIVE
Nitrite: NEGATIVE
RBC / HPF: NONE SEEN (ref 0–?)
Specific Gravity, Urine: 1.02 (ref 1.000–1.030)
Total Protein, Urine: NEGATIVE
Urine Glucose: NEGATIVE
Urobilinogen, UA: 0.2 (ref 0.0–1.0)
pH: 7.5 (ref 5.0–8.0)

## 2022-07-25 LAB — FERRITIN: Ferritin: 30.1 ng/mL (ref 22.0–322.0)

## 2022-07-25 LAB — LIPID PANEL
Cholesterol: 100 mg/dL (ref 0–200)
HDL: 38.8 mg/dL — ABNORMAL LOW (ref >39.00)
LDL Cholesterol: 45 mg/dL (ref 0–99)
NonHDL: 61.51
Total CHOL/HDL Ratio: 3
Triglycerides: 81 mg/dL (ref 0.0–149.0)
VLDL: 16.2 mg/dL (ref 0.0–40.0)

## 2022-07-25 LAB — CBC WITH DIFFERENTIAL/PLATELET
Basophils Absolute: 0 10*3/uL (ref 0.0–0.1)
Basophils Relative: 0.5 % (ref 0.0–3.0)
Eosinophils Absolute: 0.1 10*3/uL (ref 0.0–0.7)
Eosinophils Relative: 1.7 % (ref 0.0–5.0)
HCT: 44 % (ref 39.0–52.0)
Hemoglobin: 14.7 g/dL (ref 13.0–17.0)
Lymphocytes Relative: 21.7 % (ref 12.0–46.0)
Lymphs Abs: 1.8 10*3/uL (ref 0.7–4.0)
MCHC: 33.4 g/dL (ref 30.0–36.0)
MCV: 90.4 fl (ref 78.0–100.0)
Monocytes Absolute: 0.6 10*3/uL (ref 0.1–1.0)
Monocytes Relative: 7.2 % (ref 3.0–12.0)
Neutro Abs: 5.6 10*3/uL (ref 1.4–7.7)
Neutrophils Relative %: 68.9 % (ref 43.0–77.0)
Platelets: 199 10*3/uL (ref 150.0–400.0)
RBC: 4.87 Mil/uL (ref 4.22–5.81)
RDW: 13.7 % (ref 11.5–15.5)
WBC: 8.1 10*3/uL (ref 4.0–10.5)

## 2022-07-25 LAB — HEPATIC FUNCTION PANEL
ALT: 15 U/L (ref 0–53)
AST: 17 U/L (ref 0–37)
Albumin: 4.2 g/dL (ref 3.5–5.2)
Alkaline Phosphatase: 93 U/L (ref 39–117)
Bilirubin, Direct: 0.2 mg/dL (ref 0.0–0.3)
Total Bilirubin: 0.7 mg/dL (ref 0.2–1.2)
Total Protein: 7.4 g/dL (ref 6.0–8.3)

## 2022-07-25 LAB — BASIC METABOLIC PANEL
BUN: 14 mg/dL (ref 6–23)
CO2: 29 mEq/L (ref 19–32)
Calcium: 9.2 mg/dL (ref 8.4–10.5)
Chloride: 105 mEq/L (ref 96–112)
Creatinine, Ser: 1.21 mg/dL (ref 0.40–1.50)
GFR: 63.5 mL/min (ref >60.00)
Glucose, Bld: 86 mg/dL (ref 70–99)
Potassium: 4.2 mEq/L (ref 3.5–5.1)
Sodium: 142 mEq/L (ref 135–145)

## 2022-07-25 LAB — IBC PANEL
Iron: 153 ug/dL (ref 42–165)
Saturation Ratios: 50.6 % — ABNORMAL HIGH (ref 20.0–50.0)
TIBC: 302.4 ug/dL (ref 250.0–450.0)
Transferrin: 216 mg/dL (ref 212.0–360.0)

## 2022-07-25 LAB — PSA: PSA: 1.35 ng/mL (ref 0.10–4.00)

## 2022-07-25 LAB — MICROALBUMIN / CREATININE URINE RATIO
Creatinine,U: 94 mg/dL
Microalb Creat Ratio: 0.7 mg/g (ref 0.0–30.0)
Microalb, Ur: <0.7 mg/dL (ref 0.0–1.9)

## 2022-07-25 LAB — TSH: TSH: 2.62 u[IU]/mL (ref 0.35–5.50)

## 2022-07-25 LAB — VITAMIN D 25 HYDROXY (VIT D DEFICIENCY, FRACTURES): VITD: 18.91 ng/mL — ABNORMAL LOW (ref 30.00–100.00)

## 2022-07-25 LAB — VITAMIN B12: Vitamin B-12: 260 pg/mL (ref 211–911)

## 2022-07-25 LAB — HEMOGLOBIN A1C: Hgb A1c MFr Bld: 5.9 % (ref 4.6–6.5)

## 2022-07-25 MED ORDER — HYDROCORTISONE (PERIANAL) 2.5 % EX CREA: 1.0000 | TOPICAL_CREAM | Freq: Two times a day (BID) | CUTANEOUS | 1 refills | Status: DC

## 2022-07-25 NOTE — Progress Notes (Unsigned)
Patient ID: Joseph Choi, male   DOB: [birth year], 62 y.o.   MRN: [id number]         Chief Complaint:: wellness exam and ckd, hyperglycemia, htn, hld, low vit 3, hemorrhoid pain with hematochezia       HPI:  Joseph Choi is a 62 y.o. male here for wellness exam; declines covid booster, for shingrix at pharmacy, due for colonoscopy, o/w up to date                        Also did see podiatry with new orthotics, able to walk better now,  plans to walk more for exercise, has gained wt to new peak wt.  Pt denies chest pain, increased sob or doe, wheezing, orthopnea, PND, increased LE swelling, palpitations, dizziness or syncope.   Pt denies polydipsia, polyuria, or new focal neuro s/s.    Pt denies fever, wt loss, night sweats, loss of appetite, or other constitutional symptoms  Denies worsening reflux, abd pain, dysphagia, n/v, bowel change except for anal pain and small intermittent bleeding in the past 2 wks.  Due for colonoscopy with hx of polyps   Wt Readings from Last 3 Encounters:  07/25/22 (!) 302 lb (137 kg)  01/24/22 286 lb (129.7 kg)  06/23/21 291 lb (132 kg)   BP Readings from Last 3 Encounters:  07/25/22 132/78  01/24/22 (!) 148/92  06/23/21 130/80   Immunization History  Administered Date(s) Administered   Influenza Whole 02/20/2007   Influenza,inj,Quad PF,6+ Mos 12/09/2013, 12/07/2016, 02/08/2018, 12/19/2018, 12/01/2020, 01/24/2022   PFIZER(Purple Top)SARS-COV-2 Vaccination 05/28/2019, 06/28/2019, 12/29/2019   Td 03/04/2008   Tdap 09/27/2018   Health Maintenance Due  Topic Date Due   Zoster Vaccines- Shingrix (1 of 2) Never done   COLONOSCOPY (Pts 45-80yrs Insurance coverage will need to be confirmed)  03/12/2022      Past Medical History:  Diagnosis Date   ANXIETY 01/17/2007   DEPRESSION 01/17/2007   GERD 01/17/2007   HYPERLIPIDEMIA 01/17/2007   HYPERTENSION 01/17/2007   Hypertension    LOW BACK PAIN 01/17/2007   NECK PAIN, RIGHT 05/09/2010   OBSTRUCTIVE  SLEEP APNEA 01/17/2007   Sleep apnea    URI 05/09/2010   Past Surgical History:  Procedure Laterality Date   FOOT SURGERY     s/p bilateral knee arthroscopy  2011   Dr. Darrelyn Hillock    reports that he has never smoked. He has never used smokeless tobacco. He reports that he does not drink alcohol and does not use drugs. family history includes Diabetes in an other family member. No Active Allergies Current Outpatient Medications on File Prior to Visit  Medication Sig Dispense Refill   amLODipine (NORVASC) 10 MG tablet Take 1 tablet (10 mg total) by mouth daily. 90 tablet 3   aspirin 81 MG tablet Take 81 mg by mouth daily.     Cholecalciferol 50 MCG (2000 UT) TABS 1 tab by mouth once daily 30 tablet 99   metoprolol succinate (TOPROL-XL) 100 MG 24 hr tablet Take 1 tablet (100 mg total) by mouth daily. Take with or immediately following a meal. 90 tablet 3   NON FORMULARY CPAP 13 advanced     olmesartan (BENICAR) 40 MG tablet Take 1 tablet (40 mg total) by mouth daily. 90 tablet 2   rosuvastatin (CRESTOR) 10 MG tablet Take 1 tablet (10 mg total) by mouth daily. 90 tablet 3   sildenafil (REVATIO) 20 MG tablet Take 3-5 tablets once daily  as needed 150 tablet 1   silodosin (RAPAFLO) 4 MG CAPS capsule Take 1 capsule (4 mg total) by mouth daily with breakfast. 90 capsule 3   hydrochlorothiazide (MICROZIDE) 12.5 MG capsule Take 1 capsule (12.5 mg total) by mouth daily. 90 capsule 3   No current facility-administered medications on file prior to visit.        ROS:  All others reviewed and negative.  Objective        PE:  BP 132/78 (BP Location: Right Arm, Patient Position: Sitting, Cuff Size: Normal)   Pulse 62   Temp 98.5 F (36.9 C) (Oral)   Ht 5\' 8"  (1.727 m)   Wt (!) 302 lb (137 kg)   SpO2 99%   BMI 45.92 kg/m                 Constitutional: Pt appears in NAD               HENT: Head: NCAT.                Right Ear: External ear normal.                 Left Ear: External ear normal.                 Eyes: . Pupils are equal, round, and reactive to light. Conjunctivae and EOM are normal               Nose: without d/c or deformity               Neck: Neck supple. Gross normal ROM               Cardiovascular: Normal rate and regular rhythm.                 Pulmonary/Chest: Effort normal and breath sounds without rales or wheezing.                Abd:  Soft, NT, ND, + BS, no organomegaly               Neurological: Pt is alert. At baseline orientation, motor grossly intact               Skin: Skin is warm. No rashes, no other new lesions, LE edema - none               Psychiatric: Pt behavior is normal without agitation   Micro: none  Cardiac tracings I have personally interpreted today:  none  Pertinent Radiological findings (summarize): none   Lab Results  Component Value Date   WBC 8.1 07/25/2022   HGB 14.7 07/25/2022   HCT 44.0 07/25/2022   PLT 199.0 07/25/2022   GLUCOSE 86 07/25/2022   CHOL 100 07/25/2022   TRIG 81.0 07/25/2022   HDL 38.80 (L) 07/25/2022   LDLCALC 45 07/25/2022   ALT 15 07/25/2022   AST 17 07/25/2022   NA 142 07/25/2022   K 4.2 07/25/2022   CL 105 07/25/2022   CREATININE 1.21 07/25/2022   BUN 14 07/25/2022   CO2 29 07/25/2022   TSH 2.62 07/25/2022   PSA 1.35 07/25/2022   INR 1.18 07/14/2009   HGBA1C 5.9 07/25/2022   MICROALBUR <0.7 07/25/2022   Assessment/Plan:  Joseph Choi is a 62 y.o. Black or African American [2] male with  has a past medical history of ANXIETY (01/17/2007), DEPRESSION (01/17/2007), GERD (01/17/2007), HYPERLIPIDEMIA (01/17/2007), HYPERTENSION (01/17/2007), Hypertension, LOW BACK PAIN (  01/17/2007), NECK PAIN, RIGHT (05/09/2010), OBSTRUCTIVE SLEEP APNEA (01/17/2007), Sleep apnea, and URI (05/09/2010).  Vitamin D deficiency Last vitamin D Lab Results  Component Value Date   VD25OH 33.37 01/24/2022   Low, to start oral replacement   Encounter for well adult exam with abnormal findings Age and sex  appropriate education and counseling updated with regular exercise and diet Referrals for preventative services - for colonoscopy Immunizations addressed - declines covid booster, for shingrix at pharmacy Smoking counseling  - none needed Evidence for depression or other mood disorder - none significant Most recent labs reviewed. I have personally reviewed and have noted: 1) the patient's medical and social history 2) The patient's current medications and supplements 3) The patient's height, weight, and BMI have been recorded in the chart   CKD (chronic kidney disease) stage 3, GFR 30-59 ml/min (HCC) Lab Results  Component Value Date   CREATININE 1.21 07/25/2022   Stable overall, cont to avoid nephrotoxins   Hyperglycemia Lab Results  Component Value Date   HGBA1C 5.9 07/25/2022   Stable, pt to continue current medical treatment  - diet, wt control   Hyperlipidemia Lab Results  Component Value Date   LDLCALC 45 07/25/2022   Stable, pt to continue current statin crestor 10 mg qd   Hypertension, uncontrolled BP Readings from Last 3 Encounters:  07/25/22 132/78  01/24/22 (!) 148/92  06/23/21 130/80   Stable, pt to continue medical treatment norvasc 10 qd, toprol 100 qd, benicar 40 qd, hct 12.5 qd   Hematochezia Small volume recent, for anusol hc cream prn, refer GI for colonoscopy  Followup: Return in about 6 months (around 01/25/2023).  [name], MD 07/26/2022 7:56 PM  [facility]

## 2022-07-25 NOTE — Assessment & Plan Note (Signed)
Last vitamin D Lab Results  Component Value Date   VD25OH 33.37 01/24/2022   Low, to start oral replacement

## 2022-07-25 NOTE — Patient Instructions (Addendum)
Please have your Shingrix (shingles) shots done at your local pharmacy.  Please take all new medication as prescribed - the cream  Please continue all other medications as before, and refills have been done if requested.  Please have the pharmacy call with any other refills you may need.  Please continue your efforts at being more active, low cholesterol diet, and weight control.  You are otherwise up to date with prevention measures today.  Please keep your appointments with your specialists as you may have planned  You will be contacted regarding the referral for: colonoscopy  Please go to the LAB at the blood drawing area for the tests to be done  You will be contacted by phone if any changes need to be made immediately.  Otherwise, you will receive a letter about your results with an explanation, but please check with MyChart first.  Please remember to sign up for MyChart if you have not done so, as this will be important to you in the future with finding out test results, communicating by private email, and scheduling acute appointments online when needed.  Please make an Appointment to return in 6 months, or sooner if needed

## 2022-07-26 ENCOUNTER — Encounter: Payer: Self-pay | Admitting: Internal Medicine

## 2022-07-26 DIAGNOSIS — K921 Melena: Secondary | ICD-10-CM | POA: Insufficient documentation

## 2022-07-26 NOTE — Assessment & Plan Note (Signed)
Lab Results  Component Value Date   CREATININE 1.21 07/25/2022   Stable overall, cont to avoid nephrotoxins

## 2022-07-26 NOTE — Assessment & Plan Note (Signed)
Age and sex appropriate education and counseling updated with regular exercise and diet Referrals for preventative services - for colonoscopy Immunizations addressed - declines covid booster, for shingrix at pharmacy Smoking counseling  - none needed Evidence for depression or other mood disorder - none significant Most recent labs reviewed. I have personally reviewed and have noted: 1) the patient's medical and social history 2) The patient's current medications and supplements 3) The patient's height, weight, and BMI have been recorded in the chart  

## 2022-07-26 NOTE — Assessment & Plan Note (Signed)
Lab Results  Component Value Date   LDLCALC 45 07/25/2022   Stable, pt to continue current statin crestor 10 mg qd

## 2022-07-26 NOTE — Assessment & Plan Note (Signed)
Lab Results  Component Value Date   HGBA1C 5.9 07/25/2022   Stable, pt to continue current medical treatment  - diet, wt control

## 2022-07-26 NOTE — Assessment & Plan Note (Signed)
BP Readings from Last 3 Encounters:  07/25/22 132/78  01/24/22 (!) 148/92  06/23/21 130/80   Stable, pt to continue medical treatment norvasc 10 qd, toprol 100 qd, benicar 40 qd, hct 12.5 qd

## 2022-07-26 NOTE — Assessment & Plan Note (Signed)
Small volume recent, for anusol hc cream prn, refer GI for colonoscopy

## 2022-09-01 ENCOUNTER — Other Ambulatory Visit: Payer: Self-pay

## 2022-09-01 MED ORDER — ROSUVASTATIN CALCIUM 10 MG PO TABS: 10.0000 mg | ORAL_TABLET | Freq: Every day | ORAL | 3 refills | Status: DC

## 2022-09-11 ENCOUNTER — Encounter: Payer: Self-pay | Admitting: Internal Medicine

## 2022-09-12 ENCOUNTER — Other Ambulatory Visit: Payer: Self-pay

## 2022-09-12 MED ORDER — AMLODIPINE BESYLATE 10 MG PO TABS: 10.0000 mg | ORAL_TABLET | Freq: Every day | ORAL | 3 refills | Status: DC

## 2022-10-25 ENCOUNTER — Other Ambulatory Visit: Payer: Self-pay | Admitting: Internal Medicine

## 2022-11-22 IMAGING — DX DG FOOT COMPLETE 3+V*R*
3 series · 3 of 3 positions shown · non-contrast
Comparison: None.

CLINICAL DATA: Right foot pain near the first MTP joint after
injury 05/13/2020

EXAM:
RIGHT FOOT COMPLETE - 3+ VIEW

[foot ap]
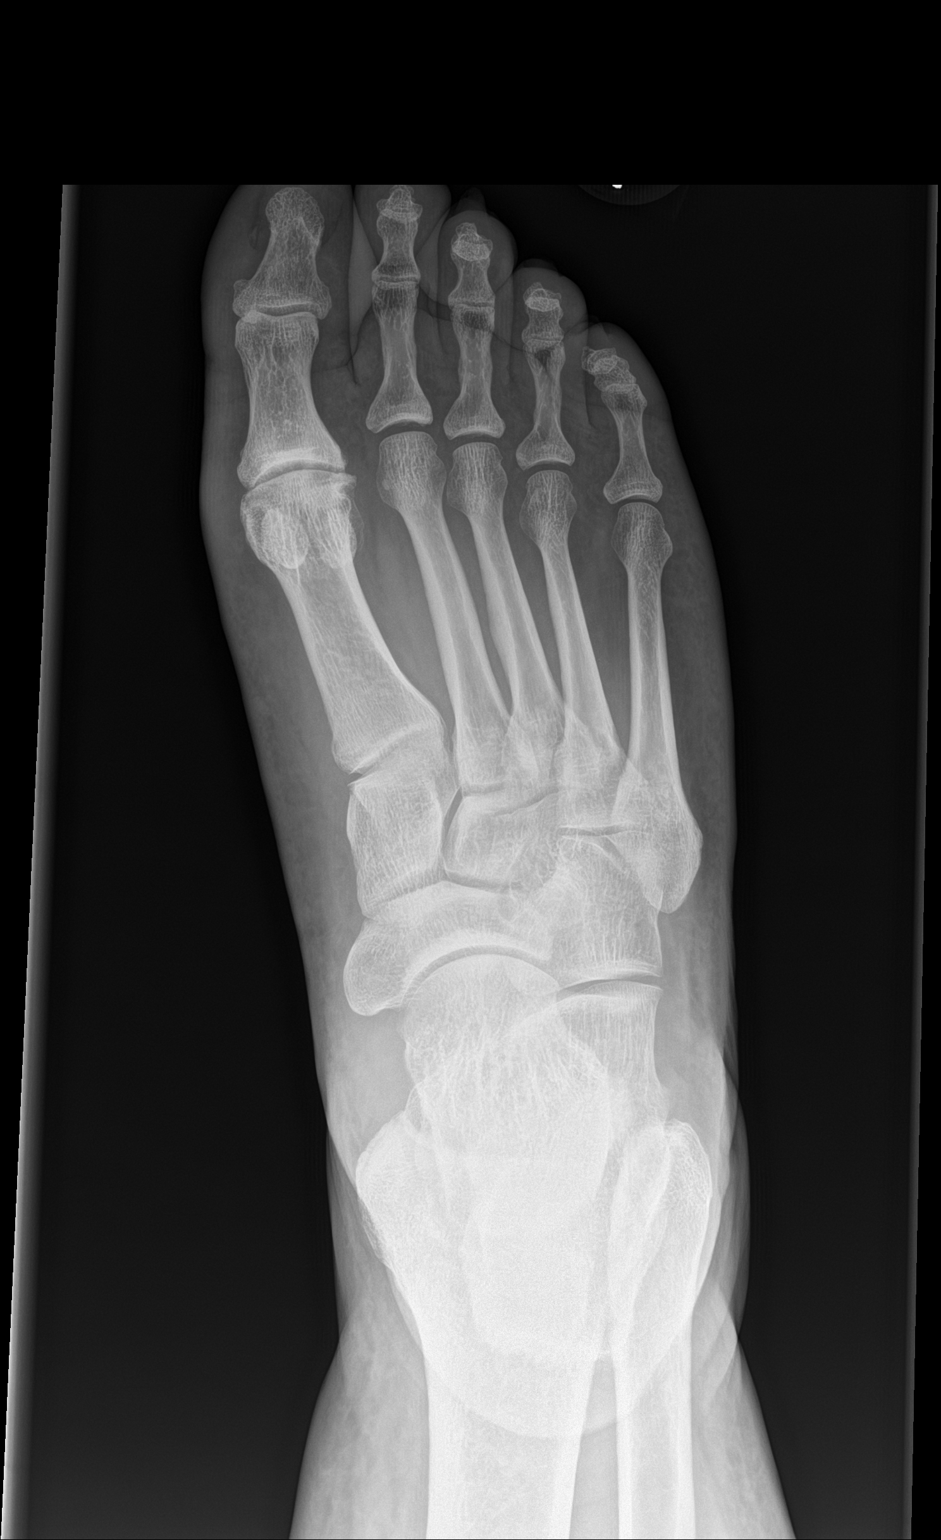

[foot obl]
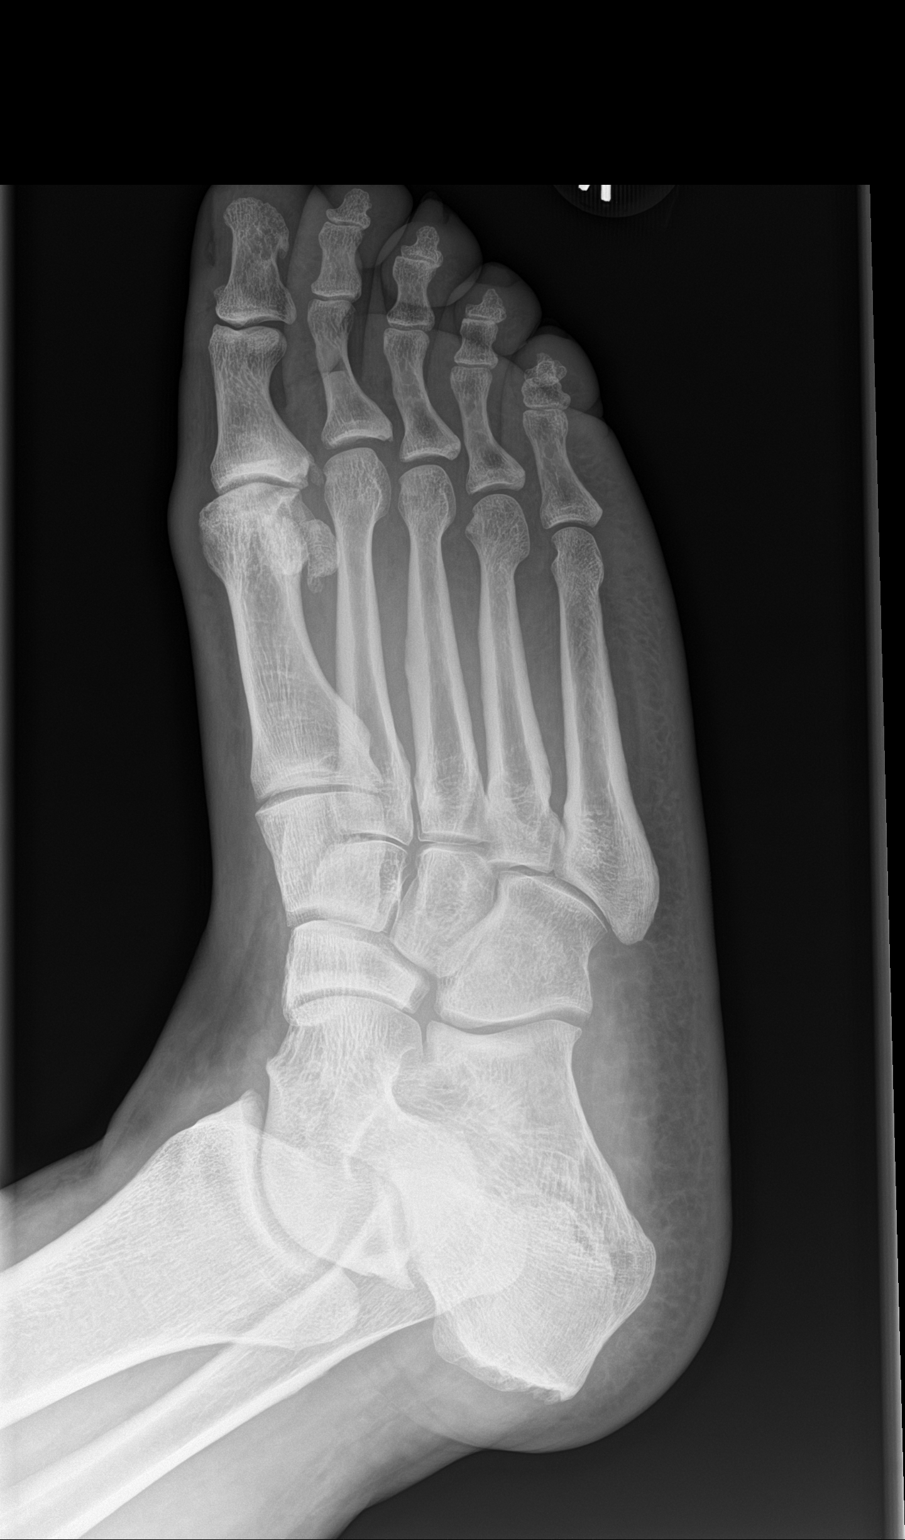

[foot lat]
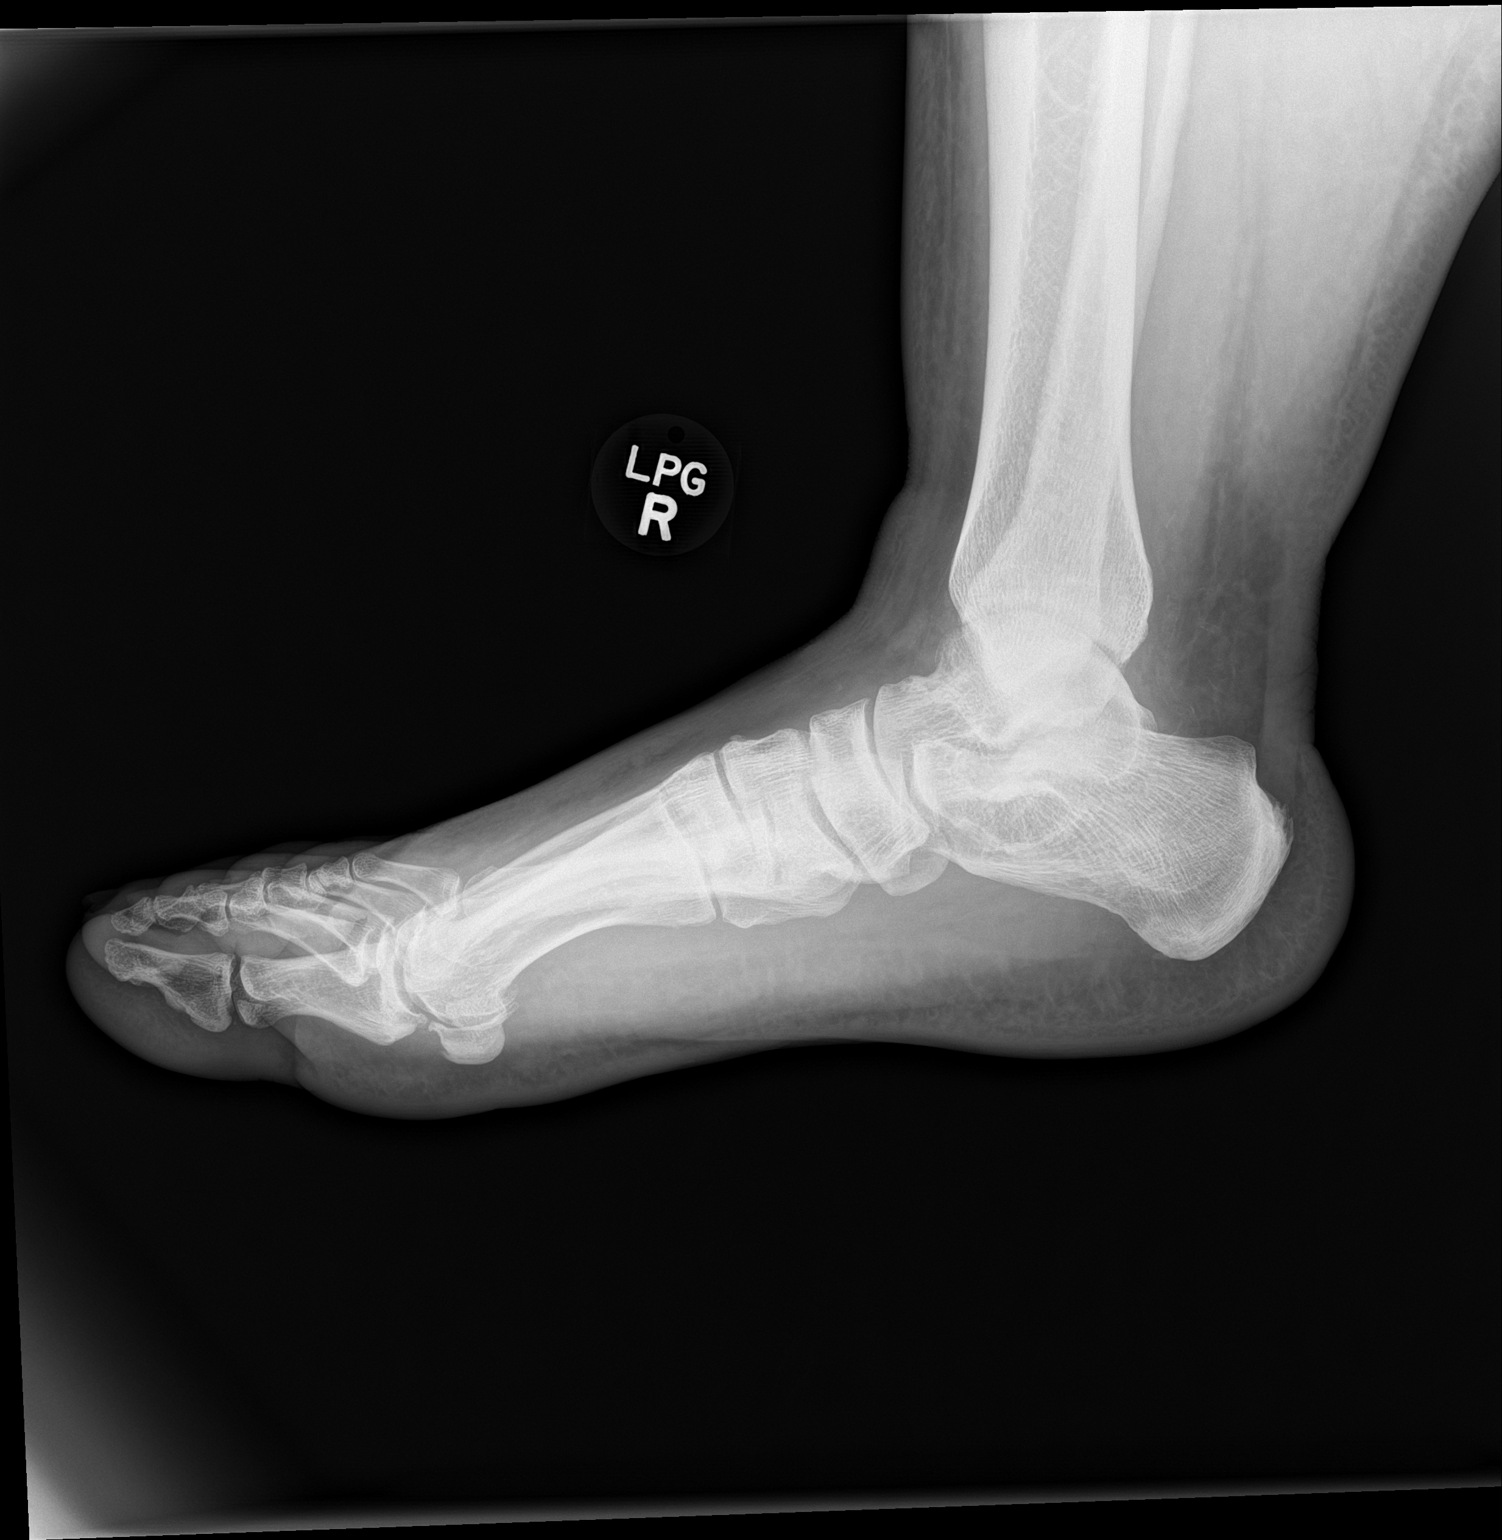

[3 of 3 positions shown; findings below may reference images not displayed]

FINDINGS: Mild-to-moderate osteoarthritis of the first MTP joint with joint
space narrowing, subchondral sclerosis, and marginal osteophyte
formation. 5 mm bony density along the plantar-lateral margin of the
great toe proximal phalanx base which could reflect an irregular
osteophyte or possibly a small avulsion fracture fragment.
Degenerative changes of the hallux sesamoid complex. Remaining
osseous structures appear intact. No malalignment. Mild soft tissue
swelling at the medial aspect of the forefoot.
IMPRESSION: Small bony density along the plantar-lateral margin of the great toe
proximal phalanx base which could reflect an irregular osteophyte or
possibly a small avulsion fracture fragment. Correlate with point
tenderness.

## 2022-12-07 ENCOUNTER — Other Ambulatory Visit: Payer: Self-pay | Admitting: Internal Medicine

## 2022-12-07 ENCOUNTER — Other Ambulatory Visit: Payer: Self-pay

## 2023-01-08 ENCOUNTER — Other Ambulatory Visit: Payer: Self-pay | Admitting: Internal Medicine

## 2023-01-08 ENCOUNTER — Other Ambulatory Visit: Payer: Self-pay

## 2023-01-17 ENCOUNTER — Other Ambulatory Visit: Payer: Self-pay | Admitting: Internal Medicine

## 2023-01-17 ENCOUNTER — Other Ambulatory Visit: Payer: Self-pay

## 2023-02-19 ENCOUNTER — Other Ambulatory Visit: Payer: Self-pay

## 2023-02-19 ENCOUNTER — Other Ambulatory Visit: Payer: Self-pay | Admitting: Internal Medicine

## 2023-02-21 ENCOUNTER — Other Ambulatory Visit: Payer: Self-pay

## 2023-02-21 ENCOUNTER — Other Ambulatory Visit: Payer: Self-pay | Admitting: Internal Medicine

## 2023-03-15 ENCOUNTER — Ambulatory Visit: Payer: BC Managed Care – PPO | Admitting: Internal Medicine

## 2023-03-15 ENCOUNTER — Encounter: Payer: Self-pay | Admitting: Internal Medicine

## 2023-03-15 VITALS — BP 160/80 | HR 73 | Temp 98.3°F | Ht 68.0 in | Wt 308.0 lb

## 2023-03-15 DIAGNOSIS — Z23 Encounter for immunization: Secondary | ICD-10-CM

## 2023-03-15 DIAGNOSIS — R739 Hyperglycemia, unspecified: Secondary | ICD-10-CM | POA: Diagnosis not present

## 2023-03-15 DIAGNOSIS — N1831 Chronic kidney disease, stage 3a: Secondary | ICD-10-CM

## 2023-03-15 DIAGNOSIS — N529 Male erectile dysfunction, unspecified: Secondary | ICD-10-CM

## 2023-03-15 DIAGNOSIS — E559 Vitamin D deficiency, unspecified: Secondary | ICD-10-CM

## 2023-03-15 DIAGNOSIS — E7800 Pure hypercholesterolemia, unspecified: Secondary | ICD-10-CM

## 2023-03-15 DIAGNOSIS — I10 Essential (primary) hypertension: Secondary | ICD-10-CM

## 2023-03-15 LAB — BASIC METABOLIC PANEL
BUN: 16 mg/dL (ref 6–23)
CO2: 30 meq/L (ref 19–32)
Calcium: 9 mg/dL (ref 8.4–10.5)
Chloride: 106 meq/L (ref 96–112)
Creatinine, Ser: 1.23 mg/dL (ref 0.40–1.50)
GFR: 61.98 mL/min (ref >60.00)
Glucose, Bld: 103 mg/dL — ABNORMAL HIGH (ref 70–99)
Potassium: 4.2 meq/L (ref 3.5–5.1)
Sodium: 141 meq/L (ref 135–145)

## 2023-03-15 LAB — LIPID PANEL
Cholesterol: 107 mg/dL (ref 0–200)
HDL: 38.8 mg/dL — ABNORMAL LOW (ref >39.00)
LDL Cholesterol: 56 mg/dL (ref 0–99)
NonHDL: 68.55
Total CHOL/HDL Ratio: 3
Triglycerides: 63 mg/dL (ref 0.0–149.0)
VLDL: 12.6 mg/dL (ref 0.0–40.0)

## 2023-03-15 LAB — HEPATIC FUNCTION PANEL
ALT: 17 U/L (ref 0–53)
AST: 26 U/L (ref 0–37)
Albumin: 4.1 g/dL (ref 3.5–5.2)
Alkaline Phosphatase: 100 U/L (ref 39–117)
Bilirubin, Direct: 0.2 mg/dL (ref 0.0–0.3)
Total Bilirubin: 0.7 mg/dL (ref 0.2–1.2)
Total Protein: 7.5 g/dL (ref 6.0–8.3)

## 2023-03-15 LAB — HEMOGLOBIN A1C: Hgb A1c MFr Bld: 6.4 % (ref 4.6–6.5)

## 2023-03-15 LAB — VITAMIN D 25 HYDROXY (VIT D DEFICIENCY, FRACTURES): VITD: 15.45 ng/mL — ABNORMAL LOW (ref 30.00–100.00)

## 2023-03-15 MED ORDER — TADALAFIL 20 MG PO TABS: 20.0000 mg | ORAL_TABLET | Freq: Every day | ORAL | 11 refills | Status: DC | PRN

## 2023-03-15 MED ORDER — HYDROCHLOROTHIAZIDE 25 MG PO TABS: 25.0000 mg | ORAL_TABLET | Freq: Every day | ORAL | 3 refills | Status: DC

## 2023-03-15 NOTE — Progress Notes (Signed)
The test results show that your current treatment is OK, as the tests are stable.  Please continue the same plan.  There is no other need for change of treatment or further evaluation based on these results, at this time.  thanks 

## 2023-03-15 NOTE — Patient Instructions (Signed)
You had the flu shot today  Ok to restart the cialis 20 mg per day as needed  Ok to increase the HCT ti 25 mg per day  Please continue all other medications as before, and refills have been done if requested.  Please have the pharmacy call with any other refills you may need.  Please continue your efforts at being more active, low cholesterol diet, and weight control.  Please keep your appointments with your specialists as you may have planned  Please call if you change your mind about the Zepbound for wt loss with the [facility] pharmacy  Please go to the LAB at the blood drawing area for the tests to be done  You will be contacted by phone if any changes need to be made immediately.  Otherwise, you will receive a letter about your results with an explanation, but please check with MyChart first.  Please make an Appointment to return in 6 months, or sooner if needed

## 2023-03-15 NOTE — Progress Notes (Signed)
Patient ID: Joseph Choi, male   DOB: [birth year], 62 y.o.   MRN: [id number]        Chief Complaint: follow up htn, ED, hld, low vit d, hyperglycemia, ckd       HPI:  Joseph Choi is a 62 y.o. male here with c/o overall doing ok, Pt denies chest pain, increased sob or doe, wheezing, orthopnea, PND, increased LE swelling, palpitations, dizziness or syncope.   Pt denies polydipsia, polyuria, or new focal neuro s/s.    Pt denies fever, wt loss, night sweats, loss of appetite, or other constitutional symptoms  In fact has been gaining wt despite walking 3 miles per day  Wt Readings from Last 3 Encounters:  03/15/23 (!) 308 lb (139.7 kg)  07/25/22 (!) 302 lb (137 kg)  01/24/22 286 lb (129.7 kg)   BP Readings from Last 3 Encounters:  03/15/23 (!) 160/80  07/25/22 132/78  01/24/22 (!) 148/92         Past Medical History:  Diagnosis Date   ANXIETY 01/17/2007   DEPRESSION 01/17/2007   GERD 01/17/2007   HYPERLIPIDEMIA 01/17/2007   HYPERTENSION 01/17/2007   Hypertension    LOW BACK PAIN 01/17/2007   NECK PAIN, RIGHT 05/09/2010   OBSTRUCTIVE SLEEP APNEA 01/17/2007   Sleep apnea    URI 05/09/2010   Past Surgical History:  Procedure Laterality Date   FOOT SURGERY     s/p bilateral knee arthroscopy  2011   Dr. Darrelyn Hillock    reports that he has never smoked. He has never used smokeless tobacco. He reports that he does not drink alcohol and does not use drugs. family history includes Diabetes in an other family member. No Active Allergies Current Outpatient Medications on File Prior to Visit  Medication Sig Dispense Refill   amLODipine (NORVASC) 10 MG tablet Take 1 tablet (10 mg total) by mouth daily. 90 tablet 3   aspirin 81 MG tablet Take 81 mg by mouth daily.     Cholecalciferol 50 MCG (2000 UT) TABS 1 tab by mouth once daily 30 tablet 99   hydrocortisone (ANUSOL-HC) 2.5 % rectal cream APPLY RECTALLY TO THE AFFECTED AREA TWICE DAILY 30 g 1   metoprolol succinate (TOPROL-XL) 100 MG 24  hr tablet TAKE 1 TABLET(100 MG) BY MOUTH DAILY WITH OR IMMEDIATELY FOLLOWING A MEAL 90 tablet 3   NON FORMULARY CPAP 13 advanced     olmesartan (BENICAR) 40 MG tablet TAKE 1 TABLET(40 MG) BY MOUTH DAILY 90 tablet 2   rosuvastatin (CRESTOR) 10 MG tablet Take 1 tablet (10 mg total) by mouth daily. 90 tablet 3   sildenafil (REVATIO) 20 MG tablet Take 3-5 tablets once daily as needed 150 tablet 1   silodosin (RAPAFLO) 4 MG CAPS capsule Take 1 capsule (4 mg total) by mouth daily with breakfast. 90 capsule 3   No current facility-administered medications on file prior to visit.        ROS:  All others reviewed and negative.  Objective        PE:  BP (!) 160/80 (BP Location: Left Arm, Patient Position: Sitting, Cuff Size: Normal)   Pulse 73   Temp 98.3 F (36.8 C) (Oral)   Ht 5\' 8"  (1.727 m)   Wt (!) 308 lb (139.7 kg)   SpO2 99%   BMI 46.83 kg/m                 Constitutional: Pt appears in NAD  HENT: Head: NCAT.                Right Ear: External ear normal.                 Left Ear: External ear normal.                Eyes: . Pupils are equal, round, and reactive to light. Conjunctivae and EOM are normal               Nose: without d/c or deformity               Neck: Neck supple. Gross normal ROM               Cardiovascular: Normal rate and regular rhythm.                 Pulmonary/Chest: Effort normal and breath sounds without rales or wheezing.                Abd:  Soft, NT, ND, + BS, no organomegaly               Neurological: Pt is alert. At baseline orientation, motor grossly intact               Skin: Skin is warm. No rashes, no other new lesions, LE edema - trace bialteral               Psychiatric: Pt behavior is normal without agitation   Micro: none  Cardiac tracings I have personally interpreted today:  none  Pertinent Radiological findings (summarize): none   Lab Results  Component Value Date   WBC 8.1 07/25/2022   HGB 14.7 07/25/2022   HCT 44.0  07/25/2022   PLT 199.0 07/25/2022   GLUCOSE 103 (H) 03/15/2023   CHOL 107 03/15/2023   TRIG 63.0 03/15/2023   HDL 38.80 (L) 03/15/2023   LDLCALC 56 03/15/2023   ALT 17 03/15/2023   AST 26 03/15/2023   NA 141 03/15/2023   K 4.2 03/15/2023   CL 106 03/15/2023   CREATININE 1.23 03/15/2023   BUN 16 03/15/2023   CO2 30 03/15/2023   TSH 2.62 07/25/2022   PSA 1.35 07/25/2022   INR 1.18 07/14/2009   HGBA1C 6.4 03/15/2023   MICROALBUR <0.7 07/25/2022   Assessment/Plan:  Joseph Choi is a 62 y.o. Black or African American [2] male with  has a past medical history of ANXIETY (01/17/2007), DEPRESSION (01/17/2007), GERD (01/17/2007), HYPERLIPIDEMIA (01/17/2007), HYPERTENSION (01/17/2007), Hypertension, LOW BACK PAIN (01/17/2007), NECK PAIN, RIGHT (05/09/2010), OBSTRUCTIVE SLEEP APNEA (01/17/2007), Sleep apnea, and URI (05/09/2010).  Hyperlipidemia Lab Results  Component Value Date   LDLCALC 56 03/15/2023   Stable, pt to continue current statin crestor 10 qd   Hypertension, uncontrolled BP Readings from Last 3 Encounters:  03/15/23 (!) 160/80  07/25/22 132/78  01/24/22 (!) 148/92   Uncontrolled,, pt to continue medical treatment except for increased hct 25 qd   Vitamin D deficiency Last vitamin D Lab Results  Component Value Date   VD25OH 15.45 (L) 03/15/2023   Low, to start oral replacement   Hyperglycemia Lab Results  Component Value Date   HGBA1C 6.4 03/15/2023   Stable, pt to continue current medical treatment  - diet, wt control   CKD (chronic kidney disease) stage 3, GFR 30-59 ml/min (HCC) Lab Results  Component Value Date   CREATININE 1.23 03/15/2023   Stable overall, cont to avoid nephrotoxins   Erectile dysfunction  Mild to mod, for cialis 20 mg prn,  to f/u any worsening symptoms or concerns  Followup: Return in about 6 months (around 09/13/2023).  [name], MD 03/17/2023 8:27 PM [facility]

## 2023-03-16 MED ORDER — GEMTESA 75 MG PO TABS: ORAL_TABLET | ORAL | 11 refills | Status: DC

## 2023-03-17 ENCOUNTER — Encounter: Payer: Self-pay | Admitting: Internal Medicine

## 2023-03-17 NOTE — Assessment & Plan Note (Signed)
Lab Results  Component Value Date   CREATININE 1.23 03/15/2023   Stable overall, cont to avoid nephrotoxins

## 2023-03-17 NOTE — Assessment & Plan Note (Signed)
BP Readings from Last 3 Encounters:  03/15/23 (!) 160/80  07/25/22 132/78  01/24/22 (!) 148/92   Uncontrolled,, pt to continue medical treatment except for increased hct 25 qd

## 2023-03-17 NOTE — Assessment & Plan Note (Signed)
Lab Results  Component Value Date   HGBA1C 6.4 03/15/2023   Stable, pt to continue current medical treatment  - diet, wt control

## 2023-03-17 NOTE — Assessment & Plan Note (Signed)
Mild to mod, for cialis 20 mg prn,  to f/u any worsening symptoms or concerns

## 2023-03-17 NOTE — Assessment & Plan Note (Signed)
Lab Results  Component Value Date   LDLCALC 56 03/15/2023   Stable, pt to continue current statin crestor 10 qd

## 2023-03-17 NOTE — Assessment & Plan Note (Signed)
Last vitamin D Lab Results  Component Value Date   VD25OH 15.45 (L) 03/15/2023   Low, to start oral replacement

## 2023-03-22 ENCOUNTER — Other Ambulatory Visit: Payer: Self-pay

## 2023-03-22 ENCOUNTER — Other Ambulatory Visit: Payer: Self-pay | Admitting: Internal Medicine

## 2023-07-03 ENCOUNTER — Telehealth: Payer: Self-pay | Admitting: Pharmacy Technician

## 2023-07-03 ENCOUNTER — Other Ambulatory Visit (HOSPITAL_COMMUNITY): Payer: Self-pay

## 2023-07-03 MED ORDER — SOLIFENACIN SUCCINATE 5 MG PO TABS: 5.0000 mg | ORAL_TABLET | Freq: Every day | ORAL | 3 refills | Status: DC

## 2023-07-03 NOTE — Telephone Encounter (Signed)
 Ok to contact [employer] will not pay for [employer].  So we can change to generic Vesicare which is similar.

## 2023-07-03 NOTE — Telephone Encounter (Signed)
 Pharmacy Patient Advocate Encounter   Received notification from CoverMyMeds that prior authorization for Gemtesa 75MG  tablets is required/requested.   Insurance verification completed.   The patient is insured through [facility] .   Per test claim:  generic urinary antispasmodic drugs is preferred by the insurance.  If suggested medication is appropriate, Please send in a new RX and discontinue this one. If not, please advise as to why it's not appropriate so that we may request a Prior Authorization. Please note, some preferred medications may still require a PA.  If the suggested medications have not been trialed and there are no contraindications to their use, the PA will not be submitted, as it will not be approved.  CMM KEY #[id number]

## 2023-07-03 NOTE — Telephone Encounter (Signed)
 See below- thanks!

## 2023-07-05 NOTE — Telephone Encounter (Signed)
 Called and left voice mail

## 2023-09-01 ENCOUNTER — Other Ambulatory Visit: Payer: Self-pay | Admitting: Internal Medicine

## 2023-09-12 ENCOUNTER — Other Ambulatory Visit: Payer: Self-pay

## 2023-09-12 ENCOUNTER — Other Ambulatory Visit: Payer: Self-pay | Admitting: Internal Medicine

## 2023-10-05 ENCOUNTER — Other Ambulatory Visit (HOSPITAL_COMMUNITY): Payer: Self-pay

## 2023-10-05 ENCOUNTER — Telehealth: Payer: Self-pay

## 2023-10-05 ENCOUNTER — Ambulatory Visit: Payer: Self-pay | Admitting: Internal Medicine

## 2023-10-05 ENCOUNTER — Ambulatory Visit: Admitting: Internal Medicine

## 2023-10-05 VITALS — BP 142/88 | Temp 97.7°F | Ht 68.0 in | Wt 300.5 lb

## 2023-10-05 DIAGNOSIS — E7800 Pure hypercholesterolemia, unspecified: Secondary | ICD-10-CM | POA: Diagnosis not present

## 2023-10-05 DIAGNOSIS — Z0000 Encounter for general adult medical examination without abnormal findings: Secondary | ICD-10-CM | POA: Diagnosis not present

## 2023-10-05 DIAGNOSIS — E538 Deficiency of other specified B group vitamins: Secondary | ICD-10-CM | POA: Diagnosis not present

## 2023-10-05 DIAGNOSIS — R350 Frequency of micturition: Secondary | ICD-10-CM

## 2023-10-05 DIAGNOSIS — Z1211 Encounter for screening for malignant neoplasm of colon: Secondary | ICD-10-CM

## 2023-10-05 DIAGNOSIS — I10 Essential (primary) hypertension: Secondary | ICD-10-CM | POA: Diagnosis not present

## 2023-10-05 DIAGNOSIS — E559 Vitamin D deficiency, unspecified: Secondary | ICD-10-CM

## 2023-10-05 DIAGNOSIS — E66813 Obesity, class 3: Secondary | ICD-10-CM | POA: Diagnosis not present

## 2023-10-05 DIAGNOSIS — N1831 Chronic kidney disease, stage 3a: Secondary | ICD-10-CM

## 2023-10-05 DIAGNOSIS — Z125 Encounter for screening for malignant neoplasm of prostate: Secondary | ICD-10-CM | POA: Diagnosis not present

## 2023-10-05 DIAGNOSIS — R739 Hyperglycemia, unspecified: Secondary | ICD-10-CM | POA: Diagnosis not present

## 2023-10-05 DIAGNOSIS — Z6841 Body Mass Index (BMI) 40.0 and over, adult: Secondary | ICD-10-CM

## 2023-10-05 DIAGNOSIS — Z0001 Encounter for general adult medical examination with abnormal findings: Secondary | ICD-10-CM

## 2023-10-05 LAB — CBC WITH DIFFERENTIAL/PLATELET
Basophils Absolute: 0 K/uL (ref 0.0–0.1)
Basophils Relative: 0.4 % (ref 0.0–3.0)
Eosinophils Absolute: 0.1 K/uL (ref 0.0–0.7)
Eosinophils Relative: 1.5 % (ref 0.0–5.0)
HCT: 48.8 % (ref 39.0–52.0)
Hemoglobin: 16 g/dL (ref 13.0–17.0)
Lymphocytes Relative: 19 % (ref 12.0–46.0)
Lymphs Abs: 1.8 K/uL (ref 0.7–4.0)
MCHC: 32.7 g/dL (ref 30.0–36.0)
MCV: 89.6 fl (ref 78.0–100.0)
Monocytes Absolute: 0.7 K/uL (ref 0.1–1.0)
Monocytes Relative: 7.7 % (ref 3.0–12.0)
Neutro Abs: 6.7 K/uL (ref 1.4–7.7)
Neutrophils Relative %: 71.4 % (ref 43.0–77.0)
Platelets: 169 K/uL (ref 150.0–400.0)
RBC: 5.45 Mil/uL (ref 4.22–5.81)
RDW: 13.6 % (ref 11.5–15.5)
WBC: 9.3 K/uL (ref 4.0–10.5)

## 2023-10-05 LAB — BASIC METABOLIC PANEL WITH GFR
BUN: 20 mg/dL (ref 6–23)
CO2: 27 meq/L (ref 19–32)
Calcium: 9.1 mg/dL (ref 8.4–10.5)
Chloride: 105 meq/L (ref 96–112)
Creatinine, Ser: 1.49 mg/dL (ref 0.40–1.50)
GFR: 49.05 mL/min — ABNORMAL LOW (ref >60.00)
Glucose, Bld: 87 mg/dL (ref 70–99)
Potassium: 4.8 meq/L (ref 3.5–5.1)
Sodium: 139 meq/L (ref 135–145)

## 2023-10-05 LAB — LIPID PANEL
Cholesterol: 85 mg/dL (ref 0–200)
HDL: 29 mg/dL — ABNORMAL LOW (ref >39.00)
LDL Cholesterol: 43 mg/dL (ref 0–99)
NonHDL: 56.19
Total CHOL/HDL Ratio: 3
Triglycerides: 67 mg/dL (ref 0.0–149.0)
VLDL: 13.4 mg/dL (ref 0.0–40.0)

## 2023-10-05 LAB — HEPATIC FUNCTION PANEL
ALT: 21 U/L (ref 0–53)
AST: 23 U/L (ref 0–37)
Albumin: 4.2 g/dL (ref 3.5–5.2)
Alkaline Phosphatase: 93 U/L (ref 39–117)
Bilirubin, Direct: 0.2 mg/dL (ref 0.0–0.3)
Total Bilirubin: 0.9 mg/dL (ref 0.2–1.2)
Total Protein: 7.4 g/dL (ref 6.0–8.3)

## 2023-10-05 LAB — URINALYSIS, ROUTINE W REFLEX MICROSCOPIC
Bilirubin Urine: NEGATIVE
Hgb urine dipstick: NEGATIVE
Ketones, ur: NEGATIVE
Leukocytes,Ua: NEGATIVE
Nitrite: NEGATIVE
Specific Gravity, Urine: 1.01 (ref 1.000–1.030)
Total Protein, Urine: NEGATIVE
Urine Glucose: NEGATIVE
Urobilinogen, UA: 0.2 (ref 0.0–1.0)
WBC, UA: NONE SEEN (ref 0–?)
pH: 7.5 (ref 5.0–8.0)

## 2023-10-05 LAB — VITAMIN D 25 HYDROXY (VIT D DEFICIENCY, FRACTURES): VITD: 22.41 ng/mL — ABNORMAL LOW (ref 30.00–100.00)

## 2023-10-05 LAB — MICROALBUMIN / CREATININE URINE RATIO
Creatinine,U: 131.7 mg/dL
Microalb Creat Ratio: UNDETERMINED mg/g (ref 0.0–30.0)
Microalb, Ur: <0.7 mg/dL

## 2023-10-05 LAB — HEMOGLOBIN A1C: Hgb A1c MFr Bld: 6.4 % (ref 4.6–6.5)

## 2023-10-05 LAB — TSH: TSH: 2.23 u[IU]/mL (ref 0.35–5.50)

## 2023-10-05 LAB — PSA: PSA: 1.37 ng/mL (ref 0.10–4.00)

## 2023-10-05 LAB — VITAMIN B12: Vitamin B-12: 382 pg/mL (ref 211–911)

## 2023-10-05 MED ORDER — AMLODIPINE BESYLATE 10 MG PO TABS: 10.0000 mg | ORAL_TABLET | Freq: Every day | ORAL | 3 refills | Status: AC

## 2023-10-05 MED ORDER — METOPROLOL SUCCINATE ER 100 MG PO TB24: 100.0000 mg | ORAL_TABLET | Freq: Every day | ORAL | 3 refills | Status: AC

## 2023-10-05 MED ORDER — OLMESARTAN MEDOXOMIL 40 MG PO TABS: 40.0000 mg | ORAL_TABLET | Freq: Every day | ORAL | 3 refills | Status: AC

## 2023-10-05 MED ORDER — HYDROCHLOROTHIAZIDE 25 MG PO TABS: 25.0000 mg | ORAL_TABLET | Freq: Every day | ORAL | 3 refills | Status: DC

## 2023-10-05 MED ORDER — ROSUVASTATIN CALCIUM 10 MG PO TABS: 10.0000 mg | ORAL_TABLET | Freq: Every day | ORAL | 1 refills | Status: DC

## 2023-10-05 MED ORDER — SOLIFENACIN SUCCINATE 10 MG PO TABS: 10.0000 mg | ORAL_TABLET | Freq: Every day | ORAL | 3 refills | Status: DC

## 2023-10-05 MED ORDER — PHENTERMINE HCL 30 MG PO CAPS: 30.0000 mg | ORAL_CAPSULE | ORAL | 1 refills | Status: AC

## 2023-10-05 MED ORDER — SILDENAFIL CITRATE 20 MG PO TABS: ORAL_TABLET | ORAL | 1 refills | Status: AC

## 2023-10-05 MED ORDER — SILODOSIN 4 MG PO CAPS: 4.0000 mg | ORAL_CAPSULE | Freq: Every day | ORAL | 3 refills | Status: AC

## 2023-10-05 NOTE — Patient Instructions (Addendum)
 Please take all new medication as prescribed - the phentermine for wt loss, and Vesicare  for bladder  Please have your Shingrix (shingles) shots done at your local pharmacy, and the Prevnar 20 pneumonia shot  Please continue all other medications as before, and refills have been done if requested.  Please have the pharmacy call with any other refills you may need.  Please continue your efforts at being more active, low cholesterol diet, and weight control.  You are otherwise up to date with prevention measures today.  Please keep your appointments with your specialists as you may have planned  You will be contacted regarding the referral for: colonoscopy  Please go to the LAB at the blood drawing area for the tests to be done  You will be contacted by phone if any changes need to be made immediately.  Otherwise, you will receive a letter about your results with an explanation, but please check with MyChart first.  Please make an Appointment to return in 6 months, or sooner if needed

## 2023-10-05 NOTE — Progress Notes (Signed)
 Patient ID: Joseph Choi, male   DOB: 28-Mar-1960, 63 y.o.   MRN: [id number]         Chief Complaint:: wellness exam and htn, obesity, OAB, low vit d, hld, hyprglycemia, ckd3a       HPI:  Joseph Choi is a 63 y.o. male here for wellness exam; for shingrix and prevnar 20 at pharmacy, due for colonoscopy, o/w up to date                Also unable to lose wt with diet and exercise, walking 3 miles per day.  Denies urinary symptoms such as dysuria, urgency, flank pain, hematuria or n/v, fever, chills but has worsening OAB symptoms with frequency in past 3 mo.  BP has been controlled at home.  Pt denies chest pain, increased sob or doe, wheezing, orthopnea, PND, increased LE swelling, palpitations, dizziness or syncope.   Pt denies polydipsia, polyuria, or new focal neuro s/s.      Wt Readings from Last 3 Encounters:  10/05/23 (!) 300 lb 8 oz (136.3 kg)  03/15/23 (!) 308 lb (139.7 kg)  07/25/22 (!) 302 lb (137 kg)   BP Readings from Last 3 Encounters:  10/05/23 (!) 142/88  03/15/23 (!) 160/80  07/25/22 132/78   Immunization History  Administered Date(s) Administered   Influenza Whole 02/20/2007   Influenza, Seasonal, Injecte, Preservative Fre 03/15/2023   Influenza,inj,Quad PF,6+ Mos 12/09/2013, 12/07/2016, 02/08/2018, 12/19/2018, 12/01/2020, 01/24/2022   PFIZER(Purple Top)SARS-COV-2 Vaccination 05/28/2019, 06/28/2019, 12/29/2019   Td 03/04/2008   Tdap 09/27/2018   Health Maintenance Due  Topic Date Due   Pneumococcal Vaccine: 50+ Years (1 of 2 - PCV) Never done   Zoster Vaccines- Shingrix (1 of 2) Never done   Colonoscopy  03/12/2022      Past Medical History:  Diagnosis Date   ANXIETY 01/17/2007   DEPRESSION 01/17/2007   GERD 01/17/2007   HYPERLIPIDEMIA 01/17/2007   HYPERTENSION 01/17/2007   Hypertension    LOW BACK PAIN 01/17/2007   NECK PAIN, RIGHT 05/09/2010   OBSTRUCTIVE SLEEP APNEA 01/17/2007   Sleep apnea    URI 05/09/2010   Past Surgical History:  Procedure  Laterality Date   FOOT SURGERY     s/p bilateral knee arthroscopy  2011   Dr. Heide    reports that he has never smoked. He has never used smokeless tobacco. He reports that he does not drink alcohol and does not use drugs. family history includes Diabetes in an other family member. No Known Allergies Current Outpatient Medications on File Prior to Visit  Medication Sig Dispense Refill   aspirin 81 MG tablet Take 81 mg by mouth daily.     Cholecalciferol  50 MCG (2000 UT) TABS 1 tab by mouth once daily 30 tablet 99   hydrocortisone  (ANUSOL -HC) 2.5 % rectal cream APPLY RECTALLY TO THE AFFECTED AREA TWICE DAILY 30 g 1   NON FORMULARY CPAP 13 advanced     No current facility-administered medications on file prior to visit.        ROS:  All others reviewed and negative.  Objective        PE:  BP (!) 142/88   Temp 97.7 F (36.5 C) (Temporal)   Ht 5' 8 (1.727 m)   Wt (!) 300 lb 8 oz (136.3 kg)   BMI 45.69 kg/m                 Constitutional: Pt appears in NAD  HENT: Head: NCAT.                Right Ear: External ear normal.                 Left Ear: External ear normal.                Eyes: . Pupils are equal, round, and reactive to light. Conjunctivae and EOM are normal               Nose: without d/c or deformity               Neck: Neck supple. Gross normal ROM               Cardiovascular: Normal rate and regular rhythm.                 Pulmonary/Chest: Effort normal and breath sounds without rales or wheezing.                Abd:  Soft, NT, ND, + BS, no organomegaly               Neurological: Pt is alert. At baseline orientation, motor grossly intact               Skin: Skin is warm. No rashes, no other new lesions, LE edema - none               Psychiatric: Pt behavior is normal without agitation   Micro: none  Cardiac tracings I have personally interpreted today:  none  Pertinent Radiological findings (summarize): none   Lab Results  Component Value  Date   WBC 9.3 10/05/2023   HGB 16.0 10/05/2023   HCT 48.8 10/05/2023   PLT 169.0 10/05/2023   GLUCOSE 87 10/05/2023   CHOL 85 10/05/2023   TRIG 67.0 10/05/2023   HDL 29.00 (L) 10/05/2023   LDLCALC 43 10/05/2023   ALT 21 10/05/2023   AST 23 10/05/2023   NA 139 10/05/2023   K 4.8 10/05/2023   CL 105 10/05/2023   CREATININE 1.49 10/05/2023   BUN 20 10/05/2023   CO2 27 10/05/2023   TSH 2.23 10/05/2023   PSA 1.37 10/05/2023   INR 1.18 07/14/2009   HGBA1C 6.4 10/05/2023   MICROALBUR <0.7 10/05/2023   Assessment/Plan:  Joseph Choi is a 63 y.o. Black or African American [2] male with  has a past medical history of ANXIETY (01/17/2007), DEPRESSION (01/17/2007), GERD (01/17/2007), HYPERLIPIDEMIA (01/17/2007), HYPERTENSION (01/17/2007), Hypertension, LOW BACK PAIN (01/17/2007), NECK PAIN, RIGHT (05/09/2010), OBSTRUCTIVE SLEEP APNEA (01/17/2007), Sleep apnea, and URI (05/09/2010).  Encounter for well adult exam with abnormal findings Age and sex appropriate education and counseling updated with regular exercise and diet Referrals for preventative services - for colonoscopy Immunizations addressed - for shingrix and prevnar 20 at pharmacy,  Smoking counseling  - none needed Evidence for depression or other mood disorder - none significant Most recent labs reviewed. I have personally reviewed and have noted: 1) the patient's medical and social history 2) The patient's current medications and supplements 3) The patient's height, weight, and BMI have been recorded in the chart   Vitamin D  deficiency Last vitamin D  Lab Results  Component Value Date   VD25OH 22.41 (L) 10/05/2023   Low, to start oral replacement   Hypertension, uncontrolled BP Readings from Last 3 Encounters:  10/05/23 (!) 142/88  03/15/23 (!) 160/80  07/25/22 132/78   Uncontrolled today but pt states ok at  home, does not want change in tx, pt to continue medical treatment norvasc  10 every day, hct 25 every  day, toprol  xl 100 every day, benicar  40 mg qd   Hyperlipidemia Lab Results  Component Value Date   LDLCALC 43 10/05/2023   Stable, pt to continue current statin crestor  10 qd   Hyperglycemia Lab Results  Component Value Date   HGBA1C 6.4 10/05/2023   Stable, pt to continue current medical treatment  - diet,w t control   CKD (chronic kidney disease) stage 3, GFR 30-59 ml/min (HCC) Lab Results  Component Value Date   CREATININE 1.49 10/05/2023   Stable overall, cont to avoid nephrotoxins   Urinary frequency Likely OAB related - for trial vesicare  5 mg qd  Obesity Pt finds glp1 unnaffordable, pt now for phentermine  30 mg qd  Followup: Return in about 6 months (around 04/06/2024).  Lynwood Rush, MD 10/06/2023 2:23 PM [facility]

## 2023-10-05 NOTE — Telephone Encounter (Addendum)
 Pharmacy Patient Advocate Encounter   Received notification from CoverMyMeds that prior authorization for Phentermine  HCL 30MG  CAPSULES is required/requested.   Insurance verification completed.   The patient is insured through [facility] .   Per test claim: PA required; PA started via CoverMyMeds. KEY [id number] . Waiting for clinical questions to populate.

## 2023-10-06 ENCOUNTER — Encounter: Payer: Self-pay | Admitting: Internal Medicine

## 2023-10-06 DIAGNOSIS — E669 Obesity, unspecified: Secondary | ICD-10-CM | POA: Insufficient documentation

## 2023-10-06 NOTE — Assessment & Plan Note (Signed)
 Likely OAB related - for trial vesicare  5 mg qd

## 2023-10-06 NOTE — Assessment & Plan Note (Signed)
 Age and sex appropriate education and counseling updated with regular exercise and diet Referrals for preventative services - for colonoscopy Immunizations addressed - for shingrix and prevnar 20 at pharmacy,  Smoking counseling  - none needed Evidence for depression or other mood disorder - none significant Most recent labs reviewed. I have personally reviewed and have noted: 1) the patient's medical and social history 2) The patient's current medications and supplements 3) The patient's height, weight, and BMI have been recorded in the chart

## 2023-10-06 NOTE — Assessment & Plan Note (Signed)
 Pt finds glp1 unnaffordable, pt now for phentermine  30 mg qd

## 2023-10-06 NOTE — Assessment & Plan Note (Signed)
 BP Readings from Last 3 Encounters:  10/05/23 (!) 142/88  03/15/23 (!) 160/80  07/25/22 132/78   Uncontrolled today but pt states ok at home, does not want change in tx, pt to continue medical treatment norvasc  10 every day, hct 25 every day, toprol  xl 100 every day, benicar  40 mg qd

## 2023-10-06 NOTE — Assessment & Plan Note (Signed)
 Lab Results  Component Value Date   CREATININE 1.49 10/05/2023   Stable overall, cont to avoid nephrotoxins

## 2023-10-06 NOTE — Assessment & Plan Note (Signed)
 Last vitamin D  Lab Results  Component Value Date   VD25OH 22.41 (L) 10/05/2023   Low, to start oral replacement

## 2023-10-06 NOTE — Assessment & Plan Note (Signed)
 Lab Results  Component Value Date   LDLCALC 43 10/05/2023   Stable, pt to continue current statin crestor  10 qd

## 2023-10-06 NOTE — Assessment & Plan Note (Signed)
 Lab Results  Component Value Date   HGBA1C 6.4 10/05/2023   Stable, pt to continue current medical treatment  - diet,w t control

## 2023-10-08 NOTE — Telephone Encounter (Signed)
 PLEASE BE ADVISED Clinical questions have been answered and PA submitted.TO PLAN. PA currently Pending.

## 2023-10-08 NOTE — Telephone Encounter (Signed)
 Pharmacy Patient Advocate Encounter  Received notification from [facility] that Prior Authorization for Phentermine  HCL 30MG  CAPSULES  has been DENIED.  Full denial letter will be uploaded to the media tab. See denial reason below.   PA #/Case ID/Reference #: [id number]

## 2023-12-12 ENCOUNTER — Other Ambulatory Visit (HOSPITAL_COMMUNITY): Payer: Self-pay

## 2023-12-12 ENCOUNTER — Telehealth: Payer: Self-pay

## 2023-12-12 NOTE — Telephone Encounter (Signed)
 Pharmacy Patient Advocate Encounter   Received notification from Pt Calls Messages that prior authorization for Sildenafil  20mg  tabs is required/requested.   Insurance verification completed.   The patient is insured through [facility] .   Per test claim: Per test claim, medication is not covered due to plan/benefit exclusion, PA not submitted at this time - Per the the PA form, Sildenafil  would be covered for pulmonary arterial hypertension and secondary Raynaud's phenomenon.   Patient can use a GoodRx card at his pharmacy and it can help with the cost. I looked up the price for #150 at a [facility] and it is $33.60

## 2023-12-12 NOTE — Telephone Encounter (Signed)
 Good morning ya'll!  I'm working through some old faxes and for this patient we had received a prior authorization request on their Sildenafil  20mg . They noted plan did not cover this medication. Can we get a prior authorization started for this and see if we can get it through or if we would have to select a preferred med? Any help is appreciated!

## 2024-03-03 ENCOUNTER — Other Ambulatory Visit: Payer: Self-pay

## 2024-03-03 ENCOUNTER — Other Ambulatory Visit: Payer: Self-pay | Admitting: Internal Medicine

## 2024-03-05 ENCOUNTER — Other Ambulatory Visit: Payer: Self-pay | Admitting: Internal Medicine

## 2024-03-05 ENCOUNTER — Other Ambulatory Visit: Payer: Self-pay

## 2024-03-18 ENCOUNTER — Telehealth: Payer: Self-pay

## 2024-03-18 NOTE — Telephone Encounter (Signed)
 Copied from CRM [id number]. Topic: Clinical - Medical Advice >> Mar 18, 2024  9:10 AM Viola F wrote: Reason for CRM: Patient wants to know who Dr. Norleen would recommend him to transfer care to?  Corean Ku, Vickie Lendia or Lauraine Pereyra?

## 2024-03-31 ENCOUNTER — Encounter: Payer: Self-pay | Admitting: Internal Medicine

## 2024-03-31 DIAGNOSIS — N3281 Overactive bladder: Secondary | ICD-10-CM

## 2024-03-31 MED ORDER — MIRABEGRON ER 25 MG PO TB24: 25.0000 mg | ORAL_TABLET | Freq: Every day | ORAL | 3 refills | Status: AC

## 2024-04-04 MED ORDER — FESOTERODINE FUMARATE ER 4 MG PO TB24: 4.0000 mg | ORAL_TABLET | Freq: Every day | ORAL | 3 refills | Status: AC

## 2024-04-04 NOTE — Addendum Note (Signed)
 Addended by: NORLEEN LYNWOOD ORN on: 04/04/2024 09:42 AM   Modules accepted: Orders

## 2024-07-31 ENCOUNTER — Encounter: Admitting: Nurse Practitioner
